# Patient Record
Sex: Female | Born: 1965 | Race: White | Hispanic: No | Marital: Married | Smoking: Never smoker
Health system: Southern US, Community
[De-identification: ages and names within clinical notes are randomized; demographics above are authoritative.]

## PROBLEM LIST (undated history)

## (undated) DIAGNOSIS — I341 Nonrheumatic mitral (valve) prolapse: Secondary | ICD-10-CM

## (undated) HISTORY — DX: Nonrheumatic mitral (valve) prolapse: I34.1

---

## 2005-04-09 ENCOUNTER — Other Ambulatory Visit: Admission: RE | Admit: 2005-04-09 | Discharge: 2005-04-09 | Payer: Self-pay | Admitting: Obstetrics and Gynecology

## 2005-07-06 ENCOUNTER — Encounter: Admission: RE | Admit: 2005-07-06 | Discharge: 2005-07-06 | Payer: Self-pay | Admitting: Obstetrics and Gynecology

## 2006-07-19 HISTORY — PX: DILATION AND CURETTAGE OF UTERUS: SHX78

## 2006-08-02 ENCOUNTER — Encounter (INDEPENDENT_AMBULATORY_CARE_PROVIDER_SITE_OTHER): Payer: Self-pay | Admitting: *Deleted

## 2006-08-02 ENCOUNTER — Ambulatory Visit (HOSPITAL_COMMUNITY): Admission: RE | Admit: 2006-08-02 | Discharge: 2006-08-02 | Payer: Self-pay | Admitting: Obstetrics and Gynecology

## 2006-08-02 ENCOUNTER — Ambulatory Visit: Admission: RE | Admit: 2006-08-02 | Discharge: 2006-08-02 | Payer: Self-pay | Admitting: Obstetrics and Gynecology

## 2006-08-09 ENCOUNTER — Encounter (INDEPENDENT_AMBULATORY_CARE_PROVIDER_SITE_OTHER): Payer: Self-pay | Admitting: Specialist

## 2006-08-09 ENCOUNTER — Ambulatory Visit (HOSPITAL_COMMUNITY): Admission: RE | Admit: 2006-08-09 | Discharge: 2006-08-09 | Payer: Self-pay | Admitting: Obstetrics and Gynecology

## 2007-08-18 ENCOUNTER — Inpatient Hospital Stay (HOSPITAL_COMMUNITY): Admission: RE | Admit: 2007-08-18 | Discharge: 2007-08-20 | Payer: Self-pay | Admitting: Obstetrics and Gynecology

## 2008-01-11 ENCOUNTER — Encounter: Admission: RE | Admit: 2008-01-11 | Discharge: 2008-01-11 | Payer: Self-pay | Admitting: Obstetrics and Gynecology

## 2008-04-26 ENCOUNTER — Ambulatory Visit (HOSPITAL_COMMUNITY): Admission: RE | Admit: 2008-04-26 | Discharge: 2008-04-26 | Payer: Self-pay | Admitting: Ophthalmology

## 2008-04-27 ENCOUNTER — Ambulatory Visit (HOSPITAL_COMMUNITY): Admission: RE | Admit: 2008-04-27 | Discharge: 2008-04-27 | Payer: Self-pay | Admitting: Ophthalmology

## 2008-07-19 HISTORY — PX: RETINAL DETACHMENT SURGERY: SHX105

## 2008-10-07 ENCOUNTER — Encounter: Admission: RE | Admit: 2008-10-07 | Discharge: 2008-10-07 | Payer: Self-pay | Admitting: Obstetrics and Gynecology

## 2010-08-09 ENCOUNTER — Encounter (HOSPITAL_COMMUNITY): Payer: Self-pay | Admitting: Obstetrics and Gynecology

## 2010-12-01 NOTE — Op Note (Signed)
Mallory Daniels, Mallory Daniels               ACCOUNT NO.:  [id number]   MEDICAL RECORD NO.:  [id number]          PATIENT TYPE:  AMB   LOCATION:  SDS                          FACILITY:  [facility]   PHYSICIAN:  Donnel Saxon, MD    DATE OF BIRTH:  09/08/1965   DATE OF PROCEDURE:  04/27/2008  DATE OF DISCHARGE:                               OPERATIVE REPORT   SURGEON:  Donnel Saxon, MD   ANESTHESIA:  General with endotracheal intubation.   PREOPERATIVE DIAGNOSES:  1. Retinal detachment, right eye.  2. 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   OPERATION PROCEDURE:  1. Scleral buckle procedure, right eye.  2. Laser demarcation and prophylaxis of the left eye.   COMPLICATIONS:  None.   FINDINGS:  There is a retinal detachment right eye and lattice  degeneration at 6 o'clock in the left eye   DESCRIPTION OF PROCEDURE:  The patient was identified in the preop  holding area.  She was then taken to the operating room where she was  sedated and placed under general anesthesia without difficulty.  At that  point in time, the right eye was prepped and draped in the usual sterile  fashion for ocular surgery including trimming of lashes.  A Lieberman  speculum was placed between the right upper and lower eyelid for  exposure.  A 360-degree conjunctival peritomy was performed with 0.3  forceps and Westcott scissors to prepare the eye for standard scleral  buckle.  Tenon capsule was dissected off the scleral all 4 quadrants  using Stevens tenotomy scissors.  Four recti muscles were isolated and  looped using 2-0 silk suture.  The sclera was inspected using a Watzke  retractor and found to be capable of supporting the scleral buckle.  The  eye was then inspected with indirect ophthalmoscopy.  The location of  the retinal break in the inferior nasal quadrant was identified and  marked externally with a surgical pen using an O'Connor scleral  indenter.  Next, confluent cryotherapy was  placed around the retinal  break.  Next, 4 horizontal mattress sutures were placed in the oblique  quadrants using a 5-0 nylon suture.  The eye was then encircled with a  #220 buckle.  At the end of the buckle brought in the inferior temporal  quadrant and joined with a #270 sleeve.  A #220 tier was then used to  support the retinal break in the inferior temporal quadrant and tucked  under the #220 buckle.  The 5-0 nylon sutures that had been previously  placed were then tied and the knots were rotated posteriorly and  trimmed.  Prior to tightening up the buckle, a radial cutdown was  performed in the bed of the inferior temporal horizontal mattress  suture.  This was brought down to bare choroid which was then cauterized  using disposable cautery.  A 30 gauge needle was then used to perforate  the choroid in this location.  Spontaneous egress of subretinal fluid  was then observed.  This stops spontaneously after a copious amount of  subretinal fluid is drained.  The eye was inspected with indirect  ophthalmoscopy and found to have a near total drain of the subretinal  fluid.  The buckle was secured in place by tying the 5-0 nylon sutures.  Next, the an injection of intraocular gas using C3F8 100% was performed.  A 0.2 mL volume was injected through the pars plana using a 30 gauge  needle.  Intraocular pressure was assessed and found to be physiologic.  The anterior chamber tap was not necessary due to the significant amount  of subretinal fluid, which was drained through the external drain site.  The buckle was repositioned and tied into place using a 5-0 nylon  sutures.  The 2-0 silk sutures were then removed and the eye was  inspected with indirect ophthalmoscopy.  There was excellent support of  the retinal break.  The conjunctiva and Tenon capsule were  reapproximated to the limbus using 7-0 Vicryl suture.  A eye was then  treated with subconjunctival injections of 25 mg of Ancef and  4 mg of  dexamethasone.  The eye was then treated topically with 1 drop of 1%  atropine and TobraDex ointment.  A Lieberman speculum was removed.  Eyelids were cleaned and closed.  They were then fashioned and shielded.  Attention was then directed towards the left eye.  A speculum was placed  between the left upper and lower eyelids for exposure.  Indirect  ophthalmoscopy was used to identify lattice degeneration in the inferior  peripheral retina.  This was surrounded by confluent laser without  difficulty.  The speculum was removed.  The patient then awoke from the  procedure having tolerated the procedure well and was taken to recovery  in excellent condition.      Donnel Saxon, MD  Electronically Signed     Donnel Saxon, MD  Electronically Signed    JBS/MEDQ  D:  04/27/2008  T:  04/27/2008  Job:  [id number]

## 2010-12-04 NOTE — Op Note (Signed)
Mallory Daniels, Mallory Daniels               ACCOUNT NO.:  [id number]   MEDICAL RECORD NO.:  [id number]          PATIENT TYPE:  AMB   LOCATION:  SDC                           FACILITY:  [facility]   PHYSICIAN:  Zelphia Cairo, MD    DATE OF BIRTH:  06/22/1966   DATE OF PROCEDURE:  08/09/2006  DATE OF DISCHARGE:                               OPERATIVE REPORT   PREOPERATIVE DIAGNOSIS:  Missed abortion.   POSTOPERATIVE DIAGNOSIS:  Missed abortion.   PROCEDURE:  Suction dilatation and evacuation.   SURGEON:  Zelphia Cairo, M.D.   ASSISTANT:  None.   ANESTHESIA:  MAC   SPECIMEN:  Products of conception.   ESTIMATED BLOOD LOSS:  Minimal.   COMPLICATIONS:  None.   CONDITION:  Stable to recovery room.   DESCRIPTION OF PROCEDURE:  The patient was taken to the operating room  where she was adequately placed under MAC anesthesia.  She was placed in  the dorsal lithotomy position using Allen stirrups.  She was prepped and  draped in a sterile fashion.  A Foley catheter was used to drain her  bladder for approximately 200 mL of clear urine. A bivalve speculum was  then placed in the vagina and a single tooth tenaculum was placed on the  anterior lip of the cervix.  A syringe with a long spinal needle was  then used to provide local cervical anesthesia using 1% Nesacaine.  The  cervix was then serially dilated using Shawnie Pons and Hegar dilators.  An 8-  French suction catheter was then used to evacuate products of conception  from the uterus.  A curet was then used to gently curet to ensure that  all products of conception had been removed. The single tooth tenaculum  and speculum were then removed.  The patient tolerated the procedure  well.  Sponge, lap, needle, and instrument counts were correct x2.  She  was discharged home in stable condition.      Zelphia Cairo, MD  Electronically Signed     GA/MEDQ  D:  08/09/2006  T:  08/09/2006  Job:  [id number]

## 2011-04-08 LAB — CCBB MATERNAL DONOR DRAW

## 2011-04-08 LAB — RPR: RPR Ser Ql: NONREACTIVE

## 2011-04-08 LAB — CBC
Hemoglobin: 9.7 — ABNORMAL LOW
MCHC: 34.8
Platelets: 192
Platelets: 193
RBC: 3.91
RDW: 13.4
WBC: 6.8

## 2011-04-20 LAB — CBC
Hemoglobin: 12.2
MCHC: 33.5
RBC: 4.01
WBC: 6.6

## 2011-09-09 ENCOUNTER — Other Ambulatory Visit: Payer: Self-pay

## 2011-09-17 ENCOUNTER — Other Ambulatory Visit: Payer: Self-pay | Admitting: Internal Medicine

## 2011-09-17 DIAGNOSIS — H9319 Tinnitus, unspecified ear: Secondary | ICD-10-CM

## 2011-09-24 ENCOUNTER — Ambulatory Visit
Admission: RE | Admit: 2011-09-24 | Discharge: 2011-09-24 | Disposition: A | Discharge status: Home / Self Care | Payer: BC Managed Care – PPO | Source: Ambulatory Visit | Attending: Internal Medicine | Admitting: Internal Medicine

## 2011-09-24 DIAGNOSIS — H9319 Tinnitus, unspecified ear: Secondary | ICD-10-CM

## 2012-02-14 ENCOUNTER — Other Ambulatory Visit: Payer: Self-pay | Admitting: Obstetrics and Gynecology

## 2012-06-05 ENCOUNTER — Other Ambulatory Visit: Payer: Self-pay | Admitting: Orthopedic Surgery

## 2012-06-05 DIAGNOSIS — M25531 Pain in right wrist: Secondary | ICD-10-CM

## 2012-06-06 ENCOUNTER — Ambulatory Visit
Admission: RE | Admit: 2012-06-06 | Discharge: 2012-06-06 | Disposition: A | Discharge status: Home / Self Care | Payer: Managed Care, Other (non HMO) | Source: Ambulatory Visit | Attending: Orthopedic Surgery | Admitting: Orthopedic Surgery

## 2012-06-06 DIAGNOSIS — M25531 Pain in right wrist: Secondary | ICD-10-CM

## 2013-03-28 ENCOUNTER — Other Ambulatory Visit: Payer: Self-pay | Admitting: Obstetrics and Gynecology

## 2013-07-19 HISTORY — PX: BREAST BIOPSY: SHX20

## 2013-10-03 ENCOUNTER — Other Ambulatory Visit: Payer: Self-pay | Admitting: Obstetrics and Gynecology

## 2013-10-03 DIAGNOSIS — N6320 Unspecified lump in the left breast, unspecified quadrant: Secondary | ICD-10-CM

## 2013-10-11 ENCOUNTER — Other Ambulatory Visit: Payer: Self-pay | Admitting: Obstetrics and Gynecology

## 2013-10-11 ENCOUNTER — Ambulatory Visit
Admission: RE | Admit: 2013-10-11 | Discharge: 2013-10-11 | Disposition: A | Discharge status: Home / Self Care | Payer: Private Health Insurance - Indemnity | Source: Ambulatory Visit | Attending: Obstetrics and Gynecology | Admitting: Obstetrics and Gynecology

## 2013-10-11 ENCOUNTER — Other Ambulatory Visit: Payer: Managed Care, Other (non HMO)

## 2013-10-11 ENCOUNTER — Ambulatory Visit
Admission: RE | Admit: 2013-10-11 | Discharge: 2013-10-11 | Disposition: A | Discharge status: Home / Self Care | Payer: Managed Care, Other (non HMO) | Source: Ambulatory Visit | Attending: Obstetrics and Gynecology | Admitting: Obstetrics and Gynecology

## 2013-10-11 DIAGNOSIS — N6320 Unspecified lump in the left breast, unspecified quadrant: Secondary | ICD-10-CM

## 2013-10-25 ENCOUNTER — Other Ambulatory Visit: Payer: Self-pay | Admitting: Obstetrics and Gynecology

## 2013-10-25 ENCOUNTER — Ambulatory Visit
Admission: RE | Admit: 2013-10-25 | Discharge: 2013-10-25 | Disposition: A | Discharge status: Home / Self Care | Payer: Managed Care, Other (non HMO) | Source: Ambulatory Visit | Attending: Obstetrics and Gynecology | Admitting: Obstetrics and Gynecology

## 2013-10-25 DIAGNOSIS — N6320 Unspecified lump in the left breast, unspecified quadrant: Secondary | ICD-10-CM

## 2014-03-14 ENCOUNTER — Other Ambulatory Visit: Payer: Self-pay | Admitting: Obstetrics and Gynecology

## 2014-03-15 LAB — CYTOLOGY - PAP

## 2014-03-18 ENCOUNTER — Other Ambulatory Visit: Payer: Self-pay | Admitting: Obstetrics and Gynecology

## 2014-03-18 DIAGNOSIS — N6320 Unspecified lump in the left breast, unspecified quadrant: Secondary | ICD-10-CM

## 2014-04-16 ENCOUNTER — Other Ambulatory Visit (HOSPITAL_COMMUNITY): Payer: Self-pay | Admitting: Obstetrics and Gynecology

## 2014-04-16 DIAGNOSIS — R1031 Right lower quadrant pain: Secondary | ICD-10-CM

## 2014-04-22 ENCOUNTER — Ambulatory Visit (HOSPITAL_COMMUNITY): Payer: Private Health Insurance - Indemnity

## 2014-04-23 ENCOUNTER — Ambulatory Visit (HOSPITAL_COMMUNITY)
Admission: RE | Admit: 2014-04-23 | Discharge: 2014-04-23 | Disposition: A | Discharge status: Home / Self Care | Payer: Managed Care, Other (non HMO) | Source: Ambulatory Visit | Attending: Obstetrics and Gynecology | Admitting: Obstetrics and Gynecology

## 2014-04-23 ENCOUNTER — Encounter (HOSPITAL_COMMUNITY): Payer: Self-pay

## 2014-04-23 DIAGNOSIS — R1031 Right lower quadrant pain: Secondary | ICD-10-CM | POA: Diagnosis not present

## 2014-04-23 MED ORDER — IOHEXOL 300 MG/ML  SOLN
100.0000 mL | Freq: Once | INTRAMUSCULAR | Status: AC | PRN
  Administered 2014-04-23: 100 mL via INTRAVENOUS

## 2014-04-25 ENCOUNTER — Ambulatory Visit
Admission: RE | Admit: 2014-04-25 | Discharge: 2014-04-25 | Disposition: A | Discharge status: Home / Self Care | Payer: Managed Care, Other (non HMO) | Source: Ambulatory Visit | Attending: Obstetrics and Gynecology | Admitting: Obstetrics and Gynecology

## 2014-04-25 DIAGNOSIS — N6320 Unspecified lump in the left breast, unspecified quadrant: Secondary | ICD-10-CM

## 2014-06-04 ENCOUNTER — Other Ambulatory Visit: Payer: Self-pay

## 2015-01-23 ENCOUNTER — Other Ambulatory Visit: Payer: Self-pay | Admitting: Obstetrics and Gynecology

## 2015-01-23 DIAGNOSIS — N6311 Unspecified lump in the right breast, upper outer quadrant: Secondary | ICD-10-CM

## 2015-01-30 ENCOUNTER — Ambulatory Visit
Admission: RE | Admit: 2015-01-30 | Discharge: 2015-01-30 | Disposition: A | Discharge status: Home / Self Care | Payer: Managed Care, Other (non HMO) | Source: Ambulatory Visit | Attending: Obstetrics and Gynecology | Admitting: Obstetrics and Gynecology

## 2015-01-30 DIAGNOSIS — N6311 Unspecified lump in the right breast, upper outer quadrant: Secondary | ICD-10-CM

## 2015-03-31 ENCOUNTER — Other Ambulatory Visit: Payer: Self-pay | Admitting: Obstetrics and Gynecology

## 2015-04-01 LAB — CYTOLOGY - PAP

## 2015-04-22 ENCOUNTER — Other Ambulatory Visit: Payer: Self-pay

## 2015-04-22 DIAGNOSIS — Z1231 Encounter for screening mammogram for malignant neoplasm of breast: Secondary | ICD-10-CM

## 2015-05-07 ENCOUNTER — Ambulatory Visit
Admission: RE | Admit: 2015-05-07 | Discharge: 2015-05-07 | Disposition: A | Discharge status: Home / Self Care | Payer: Managed Care, Other (non HMO) | Source: Ambulatory Visit

## 2015-05-07 DIAGNOSIS — Z1231 Encounter for screening mammogram for malignant neoplasm of breast: Secondary | ICD-10-CM

## 2015-05-09 ENCOUNTER — Other Ambulatory Visit: Payer: Self-pay | Admitting: Obstetrics and Gynecology

## 2015-05-09 DIAGNOSIS — R928 Other abnormal and inconclusive findings on diagnostic imaging of breast: Secondary | ICD-10-CM

## 2015-05-15 ENCOUNTER — Ambulatory Visit
Admission: RE | Admit: 2015-05-15 | Discharge: 2015-05-15 | Disposition: A | Discharge status: Home / Self Care | Payer: Managed Care, Other (non HMO) | Source: Ambulatory Visit | Attending: Obstetrics and Gynecology | Admitting: Obstetrics and Gynecology

## 2015-05-15 DIAGNOSIS — R928 Other abnormal and inconclusive findings on diagnostic imaging of breast: Secondary | ICD-10-CM

## 2015-11-24 DIAGNOSIS — H7292 Unspecified perforation of tympanic membrane, left ear: Secondary | ICD-10-CM | POA: Diagnosis not present

## 2016-02-18 DIAGNOSIS — H93293 Other abnormal auditory perceptions, bilateral: Secondary | ICD-10-CM | POA: Diagnosis not present

## 2016-02-18 DIAGNOSIS — H903 Sensorineural hearing loss, bilateral: Secondary | ICD-10-CM | POA: Diagnosis not present

## 2016-02-23 DIAGNOSIS — E559 Vitamin D deficiency, unspecified: Secondary | ICD-10-CM | POA: Diagnosis not present

## 2016-02-23 DIAGNOSIS — Z0000 Encounter for general adult medical examination without abnormal findings: Secondary | ICD-10-CM | POA: Diagnosis not present

## 2016-03-01 DIAGNOSIS — Z0000 Encounter for general adult medical examination without abnormal findings: Secondary | ICD-10-CM | POA: Diagnosis not present

## 2016-03-19 HISTORY — PX: BREAST CYST ASPIRATION: SHX578

## 2016-04-07 ENCOUNTER — Other Ambulatory Visit: Payer: Self-pay | Admitting: Obstetrics and Gynecology

## 2016-04-07 DIAGNOSIS — Z6820 Body mass index (BMI) 20.0-20.9, adult: Secondary | ICD-10-CM | POA: Diagnosis not present

## 2016-04-07 DIAGNOSIS — N6320 Unspecified lump in the left breast, unspecified quadrant: Secondary | ICD-10-CM

## 2016-04-07 DIAGNOSIS — Z01419 Encounter for gynecological examination (general) (routine) without abnormal findings: Secondary | ICD-10-CM | POA: Diagnosis not present

## 2016-04-09 ENCOUNTER — Ambulatory Visit
Admission: RE | Admit: 2016-04-09 | Discharge: 2016-04-09 | Disposition: A | Discharge status: Home / Self Care | Payer: BLUE CROSS/BLUE SHIELD | Source: Ambulatory Visit | Attending: Obstetrics and Gynecology | Admitting: Obstetrics and Gynecology

## 2016-04-09 ENCOUNTER — Other Ambulatory Visit: Payer: Self-pay | Admitting: Obstetrics and Gynecology

## 2016-04-09 DIAGNOSIS — N63 Unspecified lump in breast: Secondary | ICD-10-CM | POA: Diagnosis not present

## 2016-04-09 DIAGNOSIS — N6002 Solitary cyst of left breast: Secondary | ICD-10-CM | POA: Diagnosis not present

## 2016-04-09 DIAGNOSIS — N6320 Unspecified lump in the left breast, unspecified quadrant: Secondary | ICD-10-CM

## 2016-04-27 DIAGNOSIS — N6002 Solitary cyst of left breast: Secondary | ICD-10-CM | POA: Diagnosis not present

## 2016-05-03 ENCOUNTER — Other Ambulatory Visit: Payer: Self-pay | Admitting: Obstetrics and Gynecology

## 2016-05-03 DIAGNOSIS — Z1231 Encounter for screening mammogram for malignant neoplasm of breast: Secondary | ICD-10-CM

## 2016-05-11 ENCOUNTER — Ambulatory Visit
Admission: RE | Admit: 2016-05-11 | Discharge: 2016-05-11 | Disposition: A | Discharge status: Home / Self Care | Payer: BLUE CROSS/BLUE SHIELD | Source: Ambulatory Visit | Attending: Obstetrics and Gynecology | Admitting: Obstetrics and Gynecology

## 2016-05-11 DIAGNOSIS — Z1231 Encounter for screening mammogram for malignant neoplasm of breast: Secondary | ICD-10-CM | POA: Diagnosis not present

## 2016-06-22 DIAGNOSIS — D2262 Melanocytic nevi of left upper limb, including shoulder: Secondary | ICD-10-CM | POA: Diagnosis not present

## 2016-06-22 DIAGNOSIS — D225 Melanocytic nevi of trunk: Secondary | ICD-10-CM | POA: Diagnosis not present

## 2016-06-22 DIAGNOSIS — D2371 Other benign neoplasm of skin of right lower limb, including hip: Secondary | ICD-10-CM | POA: Diagnosis not present

## 2016-06-22 DIAGNOSIS — Z85828 Personal history of other malignant neoplasm of skin: Secondary | ICD-10-CM | POA: Diagnosis not present

## 2017-03-22 DIAGNOSIS — Z0000 Encounter for general adult medical examination without abnormal findings: Secondary | ICD-10-CM | POA: Diagnosis not present

## 2017-03-22 DIAGNOSIS — E559 Vitamin D deficiency, unspecified: Secondary | ICD-10-CM | POA: Diagnosis not present

## 2017-03-28 DIAGNOSIS — Z0000 Encounter for general adult medical examination without abnormal findings: Secondary | ICD-10-CM | POA: Diagnosis not present

## 2017-04-01 ENCOUNTER — Encounter: Payer: Self-pay | Admitting: Gastroenterology

## 2017-04-01 ENCOUNTER — Other Ambulatory Visit: Payer: Self-pay | Admitting: Obstetrics and Gynecology

## 2017-04-01 DIAGNOSIS — Z1231 Encounter for screening mammogram for malignant neoplasm of breast: Secondary | ICD-10-CM

## 2017-04-28 DIAGNOSIS — Z01419 Encounter for gynecological examination (general) (routine) without abnormal findings: Secondary | ICD-10-CM | POA: Diagnosis not present

## 2017-04-28 DIAGNOSIS — Z6820 Body mass index (BMI) 20.0-20.9, adult: Secondary | ICD-10-CM | POA: Diagnosis not present

## 2017-05-01 DIAGNOSIS — Z23 Encounter for immunization: Secondary | ICD-10-CM | POA: Diagnosis not present

## 2017-05-16 ENCOUNTER — Ambulatory Visit
Admission: RE | Admit: 2017-05-16 | Discharge: 2017-05-16 | Disposition: A | Discharge status: Home / Self Care | Payer: BLUE CROSS/BLUE SHIELD | Source: Ambulatory Visit | Attending: Obstetrics and Gynecology | Admitting: Obstetrics and Gynecology

## 2017-05-16 DIAGNOSIS — Z1231 Encounter for screening mammogram for malignant neoplasm of breast: Secondary | ICD-10-CM

## 2017-05-17 ENCOUNTER — Ambulatory Visit (AMBULATORY_SURGERY_CENTER): Payer: Self-pay | Admitting: *Deleted

## 2017-05-17 ENCOUNTER — Other Ambulatory Visit: Payer: Self-pay | Admitting: Obstetrics and Gynecology

## 2017-05-17 VITALS — Ht 72.0 in | Wt 145.2 lb

## 2017-05-17 DIAGNOSIS — N6320 Unspecified lump in the left breast, unspecified quadrant: Secondary | ICD-10-CM

## 2017-05-17 DIAGNOSIS — Z1211 Encounter for screening for malignant neoplasm of colon: Secondary | ICD-10-CM

## 2017-05-17 DIAGNOSIS — N6310 Unspecified lump in the right breast, unspecified quadrant: Secondary | ICD-10-CM

## 2017-05-17 MED ORDER — NA SULFATE-K SULFATE-MG SULF 17.5-3.13-1.6 GM/177ML PO SOLN: 1.0000 [IU] | Freq: Once | ORAL | 0 refills | Status: AC

## 2017-05-17 NOTE — Progress Notes (Signed)
No egg or soy allergy known to patient  No issues with past sedation with any surgeries  or procedures, no intubation problems  No diet pills per patient No home 02 use per patient  No blood thinners per patient  Pt denies issues with constipation  No A fib or A flutter Arrhythmia EMMI video sent to pt's e mail

## 2017-05-23 ENCOUNTER — Ambulatory Visit
Admission: RE | Admit: 2017-05-23 | Discharge: 2017-05-23 | Disposition: A | Discharge status: Home / Self Care | Payer: BLUE CROSS/BLUE SHIELD | Source: Ambulatory Visit | Attending: Obstetrics and Gynecology | Admitting: Obstetrics and Gynecology

## 2017-05-23 DIAGNOSIS — N6310 Unspecified lump in the right breast, unspecified quadrant: Secondary | ICD-10-CM

## 2017-05-23 DIAGNOSIS — R922 Inconclusive mammogram: Secondary | ICD-10-CM | POA: Diagnosis not present

## 2017-05-23 DIAGNOSIS — N6320 Unspecified lump in the left breast, unspecified quadrant: Secondary | ICD-10-CM

## 2017-05-23 DIAGNOSIS — N6489 Other specified disorders of breast: Secondary | ICD-10-CM | POA: Diagnosis not present

## 2017-05-31 ENCOUNTER — Encounter: Payer: Self-pay | Admitting: Gastroenterology

## 2017-06-03 ENCOUNTER — Other Ambulatory Visit: Payer: Self-pay

## 2017-06-03 ENCOUNTER — Encounter: Payer: Self-pay | Admitting: Gastroenterology

## 2017-06-03 ENCOUNTER — Ambulatory Visit (AMBULATORY_SURGERY_CENTER): Payer: BLUE CROSS/BLUE SHIELD | Admitting: Gastroenterology

## 2017-06-03 VITALS — BP 96/57 | HR 54 | Temp 96.8°F | Resp 20 | Ht 72.0 in | Wt 145.0 lb

## 2017-06-03 DIAGNOSIS — Z1212 Encounter for screening for malignant neoplasm of rectum: Secondary | ICD-10-CM | POA: Diagnosis not present

## 2017-06-03 DIAGNOSIS — Z1211 Encounter for screening for malignant neoplasm of colon: Secondary | ICD-10-CM

## 2017-06-03 MED ORDER — SODIUM CHLORIDE 0.9 % IV SOLN: 500.0000 mL | INTRAVENOUS | Status: DC

## 2017-06-03 NOTE — Progress Notes (Signed)
Pt's states no medical or surgical changes since previsit or office visit. 

## 2017-06-03 NOTE — Op Note (Signed)
[facility] Patient Name: Mallory GrumblingCheri Daniels Procedure Date: 06/03/2017 9:51 AM MRN: [id number] Endoscopist: Viviann SpareSteven P. Muneer Leider MD, MD Age: 51 Referring MD:  Date of Birth: 1965-12-17 Gender: Female Account #: [id number] Procedure:                Colonoscopy Indications:              Screening for colorectal malignant neoplasm, This                            is the patient's first colonoscopy Medicines:                Monitored Anesthesia Care Procedure:                Pre-Anesthesia Assessment:                           - Prior to the procedure, a History and Physical                            was performed, and patient medications and                            allergies were reviewed. The patient's tolerance of                            previous anesthesia was also reviewed. The risks                            and benefits of the procedure and the sedation                            options and risks were discussed with the patient.                            All questions were answered, and informed consent                            was obtained. Prior Anticoagulants: The patient has                            taken no previous anticoagulant or antiplatelet                            agents. ASA Grade Assessment: I - A normal, healthy                            patient. After reviewing the risks and benefits,                            the patient was deemed in satisfactory condition to                            undergo the procedure.  After obtaining informed consent, the colonoscope                            was passed under direct vision. Throughout the                            procedure, the patient's blood pressure, pulse, and                            oxygen saturations were monitored continuously. The                            Colonoscope was introduced through the anus and                            advanced to the the cecum,  identified by                            appendiceal orifice and ileocecal valve. The                            colonoscopy was performed without difficulty. The                            patient tolerated the procedure well. The quality                            of the bowel preparation was good. The ileocecal                            valve, appendiceal orifice, and rectum were                            photographed. Scope In: 9:56:30 AM Scope Out: 10:11:39 AM Scope Withdrawal Time: 0 hours 12 minutes 10 seconds  Total Procedure Duration: 0 hours 15 minutes 9 seconds  Findings:                 The perianal and digital rectal examinations were                            normal.                           Anal papilla(e) were hypertrophied.                           The exam was otherwise without abnormality. No                            polyps. Complications:            No immediate complications. Estimated blood loss:                            None. Estimated Blood Loss:     Estimated blood loss: none.  Impression:               - Anal papilla(e) were hypertrophied.                           - The examination was otherwise normal.                           - No polyps. Recommendation:           - Patient has a contact number available for                            emergencies. The signs and symptoms of potential                            delayed complications were discussed with the                            patient. Return to normal activities tomorrow.                            Written discharge instructions were provided to the                            patient.                           - Resume previous diet.                           - Continue present medications.                           - Repeat colonoscopy in 10 years for screening                            purposes. Viviann Spare P. Christipher Rieger MD, MD 06/03/2017 10:15:20 AM This report has been signed  electronically.

## 2017-06-03 NOTE — Patient Instructions (Signed)
YOU HAD AN ENDOSCOPIC PROCEDURE TODAY AT THE [facility]:   Refer to the procedure report that was given to you for any specific questions about what was found during the examination.  If the procedure report does not answer your questions, please call your gastroenterologist to clarify.  If you requested that your care partner not be given the details of your procedure findings, then the procedure report has been included in a sealed envelope for you to review at your convenience later.  YOU SHOULD EXPECT: Some feelings of bloating in the abdomen. Passage of more gas than usual.  Walking can help get rid of the air that was put into your GI tract during the procedure and reduce the bloating. If you had a lower endoscopy (such as a colonoscopy or flexible sigmoidoscopy) you may notice spotting of blood in your stool or on the toilet paper. If you underwent a bowel prep for your procedure, you may not have a normal bowel movement for a few days.  Please Note:  You might notice some irritation and congestion in your nose or some drainage.  This is from the oxygen used during your procedure.  There is no need for concern and it should clear up in a day or so.  SYMPTOMS TO REPORT IMMEDIATELY:   Following lower endoscopy (colonoscopy or flexible sigmoidoscopy):  Excessive amounts of blood in the stool  Significant tenderness or worsening of abdominal pains  Swelling of the abdomen that is new, acute  Fever of 100F or higher  For urgent or emergent issues, a gastroenterologist can be reached at any hour by calling [phone].   DIET:  We do recommend a small meal at first, but then you may proceed to your regular diet.  Drink plenty of fluids but you should avoid alcoholic beverages for 24 hours.  ACTIVITY:  You should plan to take it easy for the rest of today and you should NOT DRIVE or use heavy machinery until tomorrow (because of the sedation medicines used during the test).     FOLLOW UP: Our staff will call the number listed on your records the next business day following your procedure to check on you and address any questions or concerns that you may have regarding the information given to you following your procedure. If we do not reach you, we will leave a message.  However, if you are feeling well and you are not experiencing any problems, there is no need to return our call.  We will assume that you have returned to your regular daily activities without incident.  If any biopsies were taken you will be contacted by phone or by letter within the next 1-3 weeks.  Please call us at [phone] if you have not heard about the biopsies in 3 weeks.    SIGNATURES/CONFIDENTIALITY: You and/or your care partner have signed paperwork which will be entered into your electronic medical record.  These signatures attest to the fact that that the information above on your After Visit Summary has been reviewed and is understood.  Full responsibility of the confidentiality of this discharge information lies with you and/or your care-partner. 

## 2017-06-03 NOTE — Progress Notes (Signed)
Report given to PACU, vss 

## 2017-06-06 ENCOUNTER — Telehealth: Payer: Self-pay

## 2017-06-06 NOTE — Telephone Encounter (Signed)
  Follow up Call-  Call Bianka Liberati number 06/03/2017  Post procedure Call Ovila Lepage phone  # [phone]  Permission to leave phone message Yes  Some recent data might be hidden     Patient questions:  Do you have a fever, pain , or abdominal swelling? No. Pain Score  0 *  Have you tolerated food without any problems? Yes.    Have you been able to return to your normal activities? Yes.    Do you have any questions about your discharge instructions: Diet   No. Medications  No. Follow up visit  No.  Do you have questions or concerns about your Care? No.  Actions: * If pain score is 4 or above: No action needed, pain <4.

## 2017-06-23 DIAGNOSIS — D225 Melanocytic nevi of trunk: Secondary | ICD-10-CM | POA: Diagnosis not present

## 2017-06-23 DIAGNOSIS — D2262 Melanocytic nevi of left upper limb, including shoulder: Secondary | ICD-10-CM | POA: Diagnosis not present

## 2017-06-23 DIAGNOSIS — L720 Epidermal cyst: Secondary | ICD-10-CM | POA: Diagnosis not present

## 2017-06-23 DIAGNOSIS — L821 Other seborrheic keratosis: Secondary | ICD-10-CM | POA: Diagnosis not present

## 2017-08-22 DIAGNOSIS — R079 Chest pain, unspecified: Secondary | ICD-10-CM | POA: Diagnosis not present

## 2017-08-22 DIAGNOSIS — I341 Nonrheumatic mitral (valve) prolapse: Secondary | ICD-10-CM | POA: Diagnosis not present

## 2017-09-02 DIAGNOSIS — I499 Cardiac arrhythmia, unspecified: Secondary | ICD-10-CM | POA: Diagnosis not present

## 2017-09-02 DIAGNOSIS — R0789 Other chest pain: Secondary | ICD-10-CM | POA: Diagnosis not present

## 2017-09-14 DIAGNOSIS — R0789 Other chest pain: Secondary | ICD-10-CM | POA: Diagnosis not present

## 2017-09-14 DIAGNOSIS — R002 Palpitations: Secondary | ICD-10-CM | POA: Diagnosis not present

## 2017-09-19 DIAGNOSIS — R0789 Other chest pain: Secondary | ICD-10-CM | POA: Diagnosis not present

## 2017-09-19 DIAGNOSIS — R002 Palpitations: Secondary | ICD-10-CM | POA: Diagnosis not present

## 2018-01-24 DIAGNOSIS — N3946 Mixed incontinence: Secondary | ICD-10-CM | POA: Diagnosis not present

## 2018-01-24 DIAGNOSIS — R102 Pelvic and perineal pain: Secondary | ICD-10-CM | POA: Diagnosis not present

## 2018-01-24 DIAGNOSIS — M62838 Other muscle spasm: Secondary | ICD-10-CM | POA: Diagnosis not present

## 2018-01-24 DIAGNOSIS — M6281 Muscle weakness (generalized): Secondary | ICD-10-CM | POA: Diagnosis not present

## 2018-02-03 DIAGNOSIS — R102 Pelvic and perineal pain: Secondary | ICD-10-CM | POA: Diagnosis not present

## 2018-02-03 DIAGNOSIS — M6281 Muscle weakness (generalized): Secondary | ICD-10-CM | POA: Diagnosis not present

## 2018-02-03 DIAGNOSIS — N3946 Mixed incontinence: Secondary | ICD-10-CM | POA: Diagnosis not present

## 2018-02-03 DIAGNOSIS — M62838 Other muscle spasm: Secondary | ICD-10-CM | POA: Diagnosis not present

## 2018-02-14 DIAGNOSIS — R102 Pelvic and perineal pain: Secondary | ICD-10-CM | POA: Diagnosis not present

## 2018-02-14 DIAGNOSIS — M62838 Other muscle spasm: Secondary | ICD-10-CM | POA: Diagnosis not present

## 2018-02-14 DIAGNOSIS — N3946 Mixed incontinence: Secondary | ICD-10-CM | POA: Diagnosis not present

## 2018-02-14 DIAGNOSIS — M6281 Muscle weakness (generalized): Secondary | ICD-10-CM | POA: Diagnosis not present

## 2018-03-07 DIAGNOSIS — M6281 Muscle weakness (generalized): Secondary | ICD-10-CM | POA: Diagnosis not present

## 2018-03-07 DIAGNOSIS — N3946 Mixed incontinence: Secondary | ICD-10-CM | POA: Diagnosis not present

## 2018-03-07 DIAGNOSIS — M62838 Other muscle spasm: Secondary | ICD-10-CM | POA: Diagnosis not present

## 2018-03-07 DIAGNOSIS — R102 Pelvic and perineal pain: Secondary | ICD-10-CM | POA: Diagnosis not present

## 2018-03-15 DIAGNOSIS — M62838 Other muscle spasm: Secondary | ICD-10-CM | POA: Diagnosis not present

## 2018-03-15 DIAGNOSIS — M6281 Muscle weakness (generalized): Secondary | ICD-10-CM | POA: Diagnosis not present

## 2018-03-15 DIAGNOSIS — R102 Pelvic and perineal pain: Secondary | ICD-10-CM | POA: Diagnosis not present

## 2018-03-15 DIAGNOSIS — N8111 Cystocele, midline: Secondary | ICD-10-CM | POA: Diagnosis not present

## 2018-03-22 DIAGNOSIS — N3946 Mixed incontinence: Secondary | ICD-10-CM | POA: Diagnosis not present

## 2018-03-22 DIAGNOSIS — M6281 Muscle weakness (generalized): Secondary | ICD-10-CM | POA: Diagnosis not present

## 2018-03-22 DIAGNOSIS — M62838 Other muscle spasm: Secondary | ICD-10-CM | POA: Diagnosis not present

## 2018-03-22 DIAGNOSIS — N8111 Cystocele, midline: Secondary | ICD-10-CM | POA: Diagnosis not present

## 2018-03-29 DIAGNOSIS — E559 Vitamin D deficiency, unspecified: Secondary | ICD-10-CM | POA: Diagnosis not present

## 2018-03-29 DIAGNOSIS — Z0000 Encounter for general adult medical examination without abnormal findings: Secondary | ICD-10-CM | POA: Diagnosis not present

## 2018-04-05 DIAGNOSIS — N3946 Mixed incontinence: Secondary | ICD-10-CM | POA: Diagnosis not present

## 2018-04-05 DIAGNOSIS — M62838 Other muscle spasm: Secondary | ICD-10-CM | POA: Diagnosis not present

## 2018-04-05 DIAGNOSIS — R102 Pelvic and perineal pain: Secondary | ICD-10-CM | POA: Diagnosis not present

## 2018-04-05 DIAGNOSIS — M6281 Muscle weakness (generalized): Secondary | ICD-10-CM | POA: Diagnosis not present

## 2018-04-11 DIAGNOSIS — Z0000 Encounter for general adult medical examination without abnormal findings: Secondary | ICD-10-CM | POA: Diagnosis not present

## 2018-04-11 DIAGNOSIS — M8580 Other specified disorders of bone density and structure, unspecified site: Secondary | ICD-10-CM | POA: Diagnosis not present

## 2018-04-11 DIAGNOSIS — Z23 Encounter for immunization: Secondary | ICD-10-CM | POA: Diagnosis not present

## 2018-04-11 DIAGNOSIS — Z780 Asymptomatic menopausal state: Secondary | ICD-10-CM | POA: Diagnosis not present

## 2018-04-19 DIAGNOSIS — M62838 Other muscle spasm: Secondary | ICD-10-CM | POA: Diagnosis not present

## 2018-04-19 DIAGNOSIS — M6281 Muscle weakness (generalized): Secondary | ICD-10-CM | POA: Diagnosis not present

## 2018-04-19 DIAGNOSIS — N8111 Cystocele, midline: Secondary | ICD-10-CM | POA: Diagnosis not present

## 2018-04-19 DIAGNOSIS — N3946 Mixed incontinence: Secondary | ICD-10-CM | POA: Diagnosis not present

## 2018-04-20 ENCOUNTER — Other Ambulatory Visit: Payer: Self-pay | Admitting: Obstetrics and Gynecology

## 2018-04-20 DIAGNOSIS — Z1231 Encounter for screening mammogram for malignant neoplasm of breast: Secondary | ICD-10-CM

## 2018-04-25 DIAGNOSIS — M810 Age-related osteoporosis without current pathological fracture: Secondary | ICD-10-CM | POA: Diagnosis not present

## 2018-05-04 DIAGNOSIS — M62838 Other muscle spasm: Secondary | ICD-10-CM | POA: Diagnosis not present

## 2018-05-04 DIAGNOSIS — M6281 Muscle weakness (generalized): Secondary | ICD-10-CM | POA: Diagnosis not present

## 2018-05-04 DIAGNOSIS — N3946 Mixed incontinence: Secondary | ICD-10-CM | POA: Diagnosis not present

## 2018-05-04 DIAGNOSIS — N8111 Cystocele, midline: Secondary | ICD-10-CM | POA: Diagnosis not present

## 2018-05-11 DIAGNOSIS — Z6820 Body mass index (BMI) 20.0-20.9, adult: Secondary | ICD-10-CM | POA: Diagnosis not present

## 2018-05-11 DIAGNOSIS — Z01419 Encounter for gynecological examination (general) (routine) without abnormal findings: Secondary | ICD-10-CM | POA: Diagnosis not present

## 2018-05-11 DIAGNOSIS — R87810 Cervical high risk human papillomavirus (HPV) DNA test positive: Secondary | ICD-10-CM | POA: Diagnosis not present

## 2018-05-11 DIAGNOSIS — R87610 Atypical squamous cells of undetermined significance on cytologic smear of cervix (ASC-US): Secondary | ICD-10-CM | POA: Diagnosis not present

## 2018-05-17 DIAGNOSIS — N3946 Mixed incontinence: Secondary | ICD-10-CM | POA: Diagnosis not present

## 2018-05-17 DIAGNOSIS — M6281 Muscle weakness (generalized): Secondary | ICD-10-CM | POA: Diagnosis not present

## 2018-05-17 DIAGNOSIS — N8111 Cystocele, midline: Secondary | ICD-10-CM | POA: Diagnosis not present

## 2018-05-17 DIAGNOSIS — M62838 Other muscle spasm: Secondary | ICD-10-CM | POA: Diagnosis not present

## 2018-05-31 ENCOUNTER — Ambulatory Visit
Admission: RE | Admit: 2018-05-31 | Discharge: 2018-05-31 | Disposition: A | Discharge status: Home / Self Care | Payer: BLUE CROSS/BLUE SHIELD | Source: Ambulatory Visit | Attending: Obstetrics and Gynecology | Admitting: Obstetrics and Gynecology

## 2018-05-31 DIAGNOSIS — Z1231 Encounter for screening mammogram for malignant neoplasm of breast: Secondary | ICD-10-CM

## 2018-06-01 DIAGNOSIS — M62838 Other muscle spasm: Secondary | ICD-10-CM | POA: Diagnosis not present

## 2018-06-01 DIAGNOSIS — N8111 Cystocele, midline: Secondary | ICD-10-CM | POA: Diagnosis not present

## 2018-06-01 DIAGNOSIS — N3946 Mixed incontinence: Secondary | ICD-10-CM | POA: Diagnosis not present

## 2018-06-01 DIAGNOSIS — M6281 Muscle weakness (generalized): Secondary | ICD-10-CM | POA: Diagnosis not present

## 2018-06-08 DIAGNOSIS — R87612 Low grade squamous intraepithelial lesion on cytologic smear of cervix (LGSIL): Secondary | ICD-10-CM | POA: Diagnosis not present

## 2018-06-08 DIAGNOSIS — R87810 Cervical high risk human papillomavirus (HPV) DNA test positive: Secondary | ICD-10-CM | POA: Diagnosis not present

## 2018-06-08 DIAGNOSIS — N870 Mild cervical dysplasia: Secondary | ICD-10-CM | POA: Diagnosis not present

## 2018-06-23 DIAGNOSIS — M62838 Other muscle spasm: Secondary | ICD-10-CM | POA: Diagnosis not present

## 2018-06-23 DIAGNOSIS — D2371 Other benign neoplasm of skin of right lower limb, including hip: Secondary | ICD-10-CM | POA: Diagnosis not present

## 2018-06-23 DIAGNOSIS — L814 Other melanin hyperpigmentation: Secondary | ICD-10-CM | POA: Diagnosis not present

## 2018-06-23 DIAGNOSIS — D2262 Melanocytic nevi of left upper limb, including shoulder: Secondary | ICD-10-CM | POA: Diagnosis not present

## 2018-06-23 DIAGNOSIS — R102 Pelvic and perineal pain: Secondary | ICD-10-CM | POA: Diagnosis not present

## 2018-06-23 DIAGNOSIS — M25552 Pain in left hip: Secondary | ICD-10-CM | POA: Diagnosis not present

## 2018-06-23 DIAGNOSIS — D225 Melanocytic nevi of trunk: Secondary | ICD-10-CM | POA: Diagnosis not present

## 2018-06-23 DIAGNOSIS — M6281 Muscle weakness (generalized): Secondary | ICD-10-CM | POA: Diagnosis not present

## 2018-07-05 DIAGNOSIS — N8111 Cystocele, midline: Secondary | ICD-10-CM | POA: Diagnosis not present

## 2018-07-05 DIAGNOSIS — N3946 Mixed incontinence: Secondary | ICD-10-CM | POA: Diagnosis not present

## 2018-07-05 DIAGNOSIS — M62838 Other muscle spasm: Secondary | ICD-10-CM | POA: Diagnosis not present

## 2018-07-05 DIAGNOSIS — M6281 Muscle weakness (generalized): Secondary | ICD-10-CM | POA: Diagnosis not present

## 2018-08-16 DIAGNOSIS — M62838 Other muscle spasm: Secondary | ICD-10-CM | POA: Diagnosis not present

## 2018-08-16 DIAGNOSIS — M6281 Muscle weakness (generalized): Secondary | ICD-10-CM | POA: Diagnosis not present

## 2018-08-16 DIAGNOSIS — R102 Pelvic and perineal pain: Secondary | ICD-10-CM | POA: Diagnosis not present

## 2018-08-16 DIAGNOSIS — N8111 Cystocele, midline: Secondary | ICD-10-CM | POA: Diagnosis not present

## 2018-09-20 DIAGNOSIS — N8111 Cystocele, midline: Secondary | ICD-10-CM | POA: Diagnosis not present

## 2018-09-20 DIAGNOSIS — M62838 Other muscle spasm: Secondary | ICD-10-CM | POA: Diagnosis not present

## 2018-09-20 DIAGNOSIS — M6281 Muscle weakness (generalized): Secondary | ICD-10-CM | POA: Diagnosis not present

## 2018-09-20 DIAGNOSIS — N3946 Mixed incontinence: Secondary | ICD-10-CM | POA: Diagnosis not present

## 2018-09-21 DIAGNOSIS — L820 Inflamed seborrheic keratosis: Secondary | ICD-10-CM | POA: Diagnosis not present

## 2019-04-12 DIAGNOSIS — Z0000 Encounter for general adult medical examination without abnormal findings: Secondary | ICD-10-CM | POA: Diagnosis not present

## 2019-04-12 DIAGNOSIS — H0015 Chalazion left lower eyelid: Secondary | ICD-10-CM | POA: Diagnosis not present

## 2019-04-12 DIAGNOSIS — Z13220 Encounter for screening for lipoid disorders: Secondary | ICD-10-CM | POA: Diagnosis not present

## 2019-04-19 ENCOUNTER — Other Ambulatory Visit: Payer: Self-pay | Admitting: Obstetrics and Gynecology

## 2019-04-19 DIAGNOSIS — N6310 Unspecified lump in the right breast, unspecified quadrant: Secondary | ICD-10-CM | POA: Diagnosis not present

## 2019-04-19 DIAGNOSIS — N63 Unspecified lump in unspecified breast: Secondary | ICD-10-CM

## 2019-04-27 ENCOUNTER — Ambulatory Visit
Admission: RE | Admit: 2019-04-27 | Discharge: 2019-04-27 | Disposition: A | Discharge status: Home / Self Care | Payer: BLUE CROSS/BLUE SHIELD | Source: Ambulatory Visit | Attending: Obstetrics and Gynecology | Admitting: Obstetrics and Gynecology

## 2019-04-27 ENCOUNTER — Other Ambulatory Visit: Payer: Self-pay

## 2019-04-27 ENCOUNTER — Ambulatory Visit
Admission: RE | Admit: 2019-04-27 | Discharge: 2019-04-27 | Disposition: A | Discharge status: Home / Self Care | Payer: BC Managed Care – PPO | Source: Ambulatory Visit | Attending: Obstetrics and Gynecology | Admitting: Obstetrics and Gynecology

## 2019-04-27 DIAGNOSIS — N6322 Unspecified lump in the left breast, upper inner quadrant: Secondary | ICD-10-CM | POA: Diagnosis not present

## 2019-04-27 DIAGNOSIS — N63 Unspecified lump in unspecified breast: Secondary | ICD-10-CM

## 2019-04-27 DIAGNOSIS — R922 Inconclusive mammogram: Secondary | ICD-10-CM | POA: Diagnosis not present

## 2019-04-27 DIAGNOSIS — N6312 Unspecified lump in the right breast, upper inner quadrant: Secondary | ICD-10-CM | POA: Diagnosis not present

## 2019-05-09 DIAGNOSIS — Z23 Encounter for immunization: Secondary | ICD-10-CM | POA: Diagnosis not present

## 2019-05-09 DIAGNOSIS — Z0000 Encounter for general adult medical examination without abnormal findings: Secondary | ICD-10-CM | POA: Diagnosis not present

## 2019-05-09 DIAGNOSIS — M810 Age-related osteoporosis without current pathological fracture: Secondary | ICD-10-CM | POA: Diagnosis not present

## 2019-05-16 DIAGNOSIS — Z01419 Encounter for gynecological examination (general) (routine) without abnormal findings: Secondary | ICD-10-CM | POA: Diagnosis not present

## 2019-05-16 DIAGNOSIS — Z6821 Body mass index (BMI) 21.0-21.9, adult: Secondary | ICD-10-CM | POA: Diagnosis not present

## 2019-05-16 DIAGNOSIS — R87612 Low grade squamous intraepithelial lesion on cytologic smear of cervix (LGSIL): Secondary | ICD-10-CM | POA: Diagnosis not present

## 2019-08-03 DIAGNOSIS — D1801 Hemangioma of skin and subcutaneous tissue: Secondary | ICD-10-CM | POA: Diagnosis not present

## 2019-08-03 DIAGNOSIS — C44319 Basal cell carcinoma of skin of other parts of face: Secondary | ICD-10-CM | POA: Diagnosis not present

## 2019-08-03 DIAGNOSIS — D2271 Melanocytic nevi of right lower limb, including hip: Secondary | ICD-10-CM | POA: Diagnosis not present

## 2019-08-03 DIAGNOSIS — D2371 Other benign neoplasm of skin of right lower limb, including hip: Secondary | ICD-10-CM | POA: Diagnosis not present

## 2019-08-03 DIAGNOSIS — D225 Melanocytic nevi of trunk: Secondary | ICD-10-CM | POA: Diagnosis not present

## 2019-08-07 ENCOUNTER — Other Ambulatory Visit: Payer: Self-pay | Admitting: Obstetrics and Gynecology

## 2019-08-07 DIAGNOSIS — N6001 Solitary cyst of right breast: Secondary | ICD-10-CM

## 2019-08-14 ENCOUNTER — Other Ambulatory Visit: Payer: Self-pay | Admitting: Obstetrics and Gynecology

## 2019-08-14 ENCOUNTER — Other Ambulatory Visit: Payer: Self-pay

## 2019-08-14 ENCOUNTER — Ambulatory Visit
Admission: RE | Admit: 2019-08-14 | Discharge: 2019-08-14 | Disposition: A | Discharge status: Home / Self Care | Payer: BC Managed Care – PPO | Source: Ambulatory Visit | Attending: Obstetrics and Gynecology | Admitting: Obstetrics and Gynecology

## 2019-08-14 DIAGNOSIS — R922 Inconclusive mammogram: Secondary | ICD-10-CM | POA: Diagnosis not present

## 2019-08-14 DIAGNOSIS — N6001 Solitary cyst of right breast: Secondary | ICD-10-CM

## 2019-08-20 ENCOUNTER — Ambulatory Visit
Admission: RE | Admit: 2019-08-20 | Discharge: 2019-08-20 | Disposition: A | Discharge status: Home / Self Care | Payer: BC Managed Care – PPO | Source: Ambulatory Visit | Attending: Obstetrics and Gynecology | Admitting: Obstetrics and Gynecology

## 2019-08-20 ENCOUNTER — Other Ambulatory Visit: Payer: Self-pay

## 2019-08-20 DIAGNOSIS — N6001 Solitary cyst of right breast: Secondary | ICD-10-CM | POA: Diagnosis not present

## 2019-08-20 HISTORY — PX: BREAST CYST ASPIRATION: SHX578

## 2019-08-30 DIAGNOSIS — C44319 Basal cell carcinoma of skin of other parts of face: Secondary | ICD-10-CM | POA: Diagnosis not present

## 2020-03-19 ENCOUNTER — Other Ambulatory Visit: Payer: Self-pay | Admitting: Obstetrics and Gynecology

## 2020-03-19 DIAGNOSIS — Z1231 Encounter for screening mammogram for malignant neoplasm of breast: Secondary | ICD-10-CM

## 2020-05-01 ENCOUNTER — Ambulatory Visit
Admission: RE | Admit: 2020-05-01 | Discharge: 2020-05-01 | Disposition: A | Discharge status: Home / Self Care | Payer: BC Managed Care – PPO | Source: Ambulatory Visit | Attending: Obstetrics and Gynecology | Admitting: Obstetrics and Gynecology

## 2020-05-01 ENCOUNTER — Other Ambulatory Visit: Payer: Self-pay

## 2020-05-01 DIAGNOSIS — Z1231 Encounter for screening mammogram for malignant neoplasm of breast: Secondary | ICD-10-CM | POA: Diagnosis not present

## 2020-05-08 DIAGNOSIS — M810 Age-related osteoporosis without current pathological fracture: Secondary | ICD-10-CM | POA: Diagnosis not present

## 2020-05-08 DIAGNOSIS — Z13220 Encounter for screening for lipoid disorders: Secondary | ICD-10-CM | POA: Diagnosis not present

## 2020-05-08 DIAGNOSIS — E559 Vitamin D deficiency, unspecified: Secondary | ICD-10-CM | POA: Diagnosis not present

## 2020-05-08 DIAGNOSIS — Z0000 Encounter for general adult medical examination without abnormal findings: Secondary | ICD-10-CM | POA: Diagnosis not present

## 2020-05-15 DIAGNOSIS — Z0000 Encounter for general adult medical examination without abnormal findings: Secondary | ICD-10-CM | POA: Diagnosis not present

## 2020-05-15 DIAGNOSIS — M810 Age-related osteoporosis without current pathological fracture: Secondary | ICD-10-CM | POA: Diagnosis not present

## 2020-05-15 DIAGNOSIS — Z23 Encounter for immunization: Secondary | ICD-10-CM | POA: Diagnosis not present

## 2020-05-22 DIAGNOSIS — M810 Age-related osteoporosis without current pathological fracture: Secondary | ICD-10-CM | POA: Diagnosis not present

## 2020-05-22 DIAGNOSIS — E559 Vitamin D deficiency, unspecified: Secondary | ICD-10-CM | POA: Diagnosis not present

## 2020-05-23 DIAGNOSIS — Z01419 Encounter for gynecological examination (general) (routine) without abnormal findings: Secondary | ICD-10-CM | POA: Diagnosis not present

## 2020-05-23 DIAGNOSIS — N8189 Other female genital prolapse: Secondary | ICD-10-CM | POA: Diagnosis not present

## 2020-05-23 DIAGNOSIS — Z6822 Body mass index (BMI) 22.0-22.9, adult: Secondary | ICD-10-CM | POA: Diagnosis not present

## 2020-05-26 IMAGING — US US BREAST*L* LIMITED INC AXILLA
1 series · 9 of 9 positions shown · non-contrast
Comparison: Previous exam(s).

CLINICAL DATA: 53-year-old female presenting for evaluation of
bilateral palpable lumps. She has history cyst aspiration and a
biopsy in the left breast with pathology results indicating benign
ruptured cyst.

EXAM:
DIGITAL DIAGNOSTIC BILATERAL MAMMOGRAM WITH CAD AND TOMO
ULTRASOUND BILATERAL BREAST

[Series 1: us breast*left* limited inc axilla · 0.07mm/px · 9 of 9 slices shown]
[im 1/9]
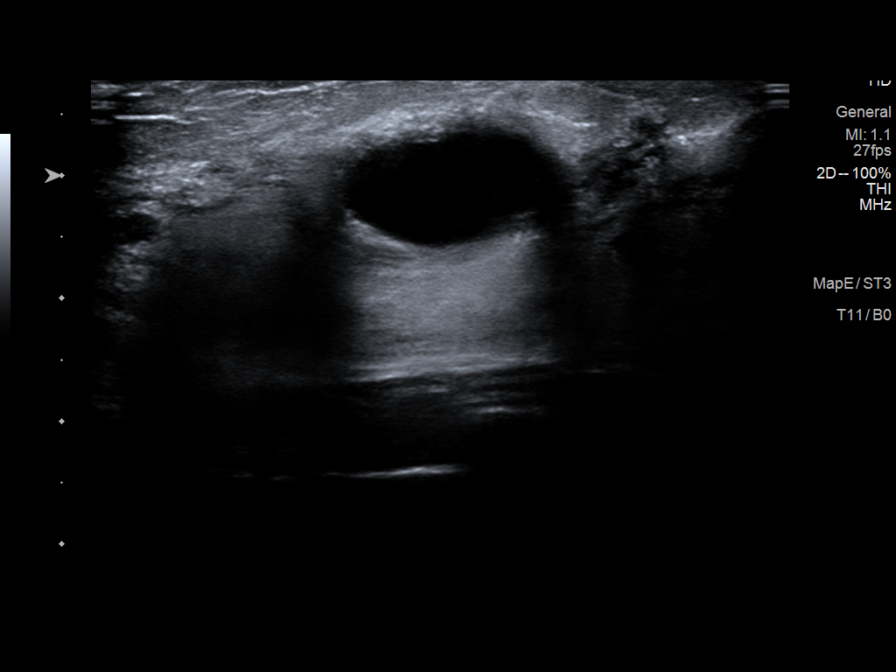
[im 2/9]
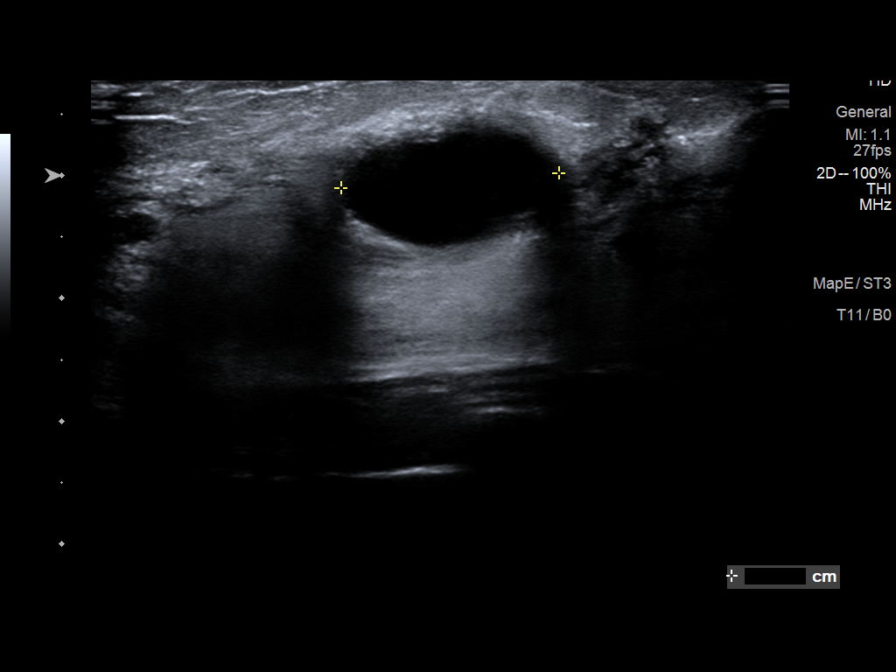
[im 3/9]
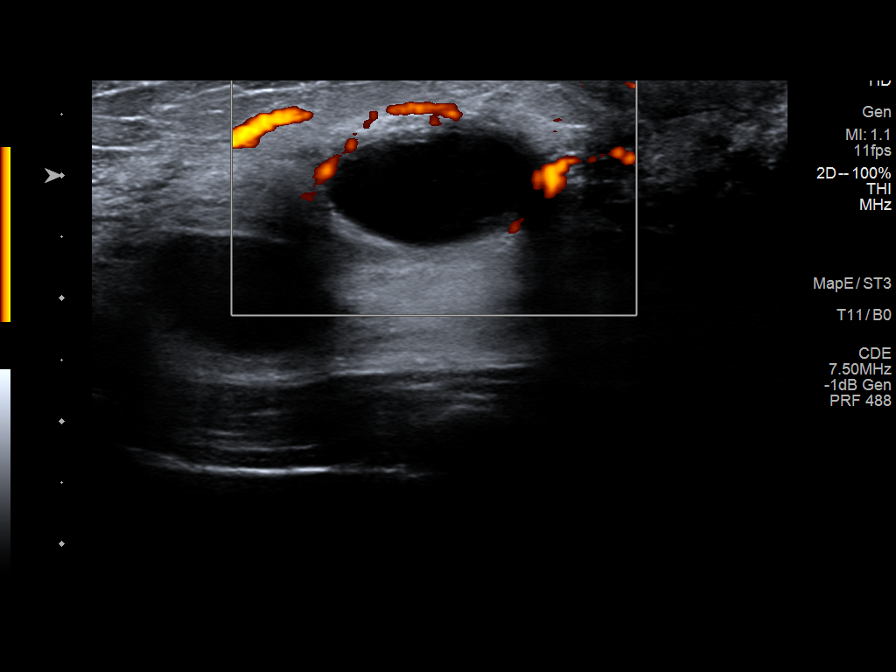
[im 4/9]
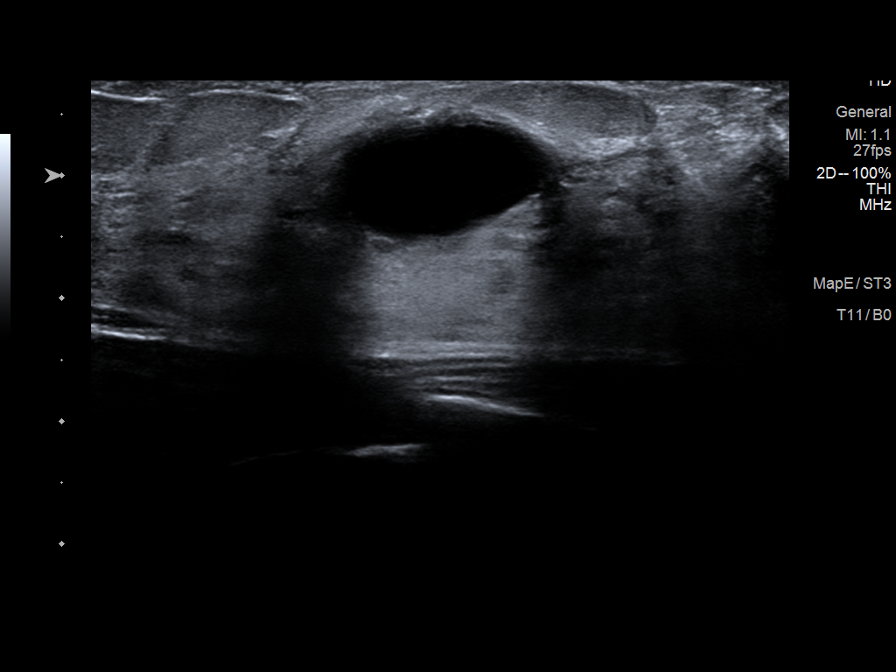
[im 5/9]
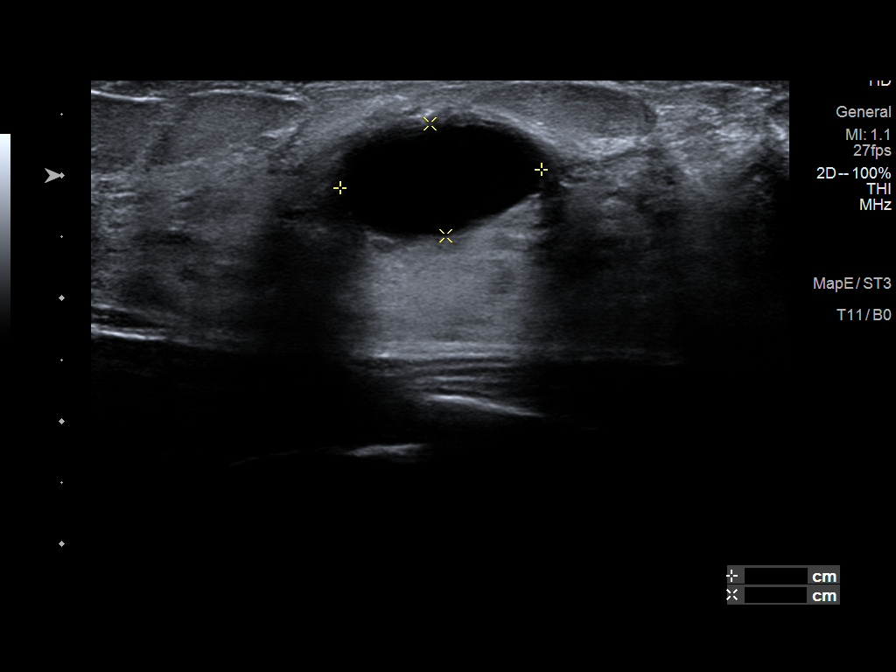
[im 6/9]
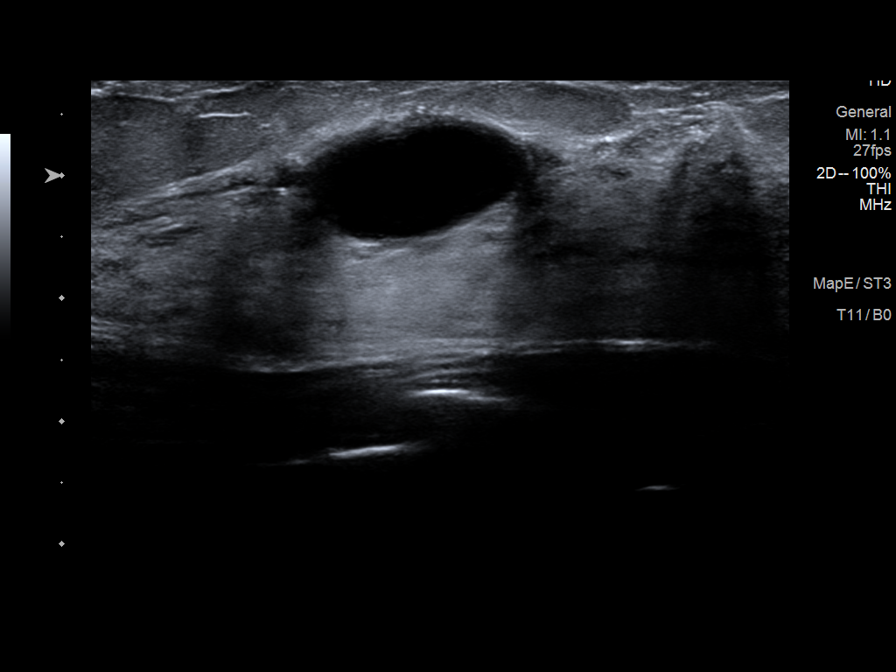
[im 7/9]
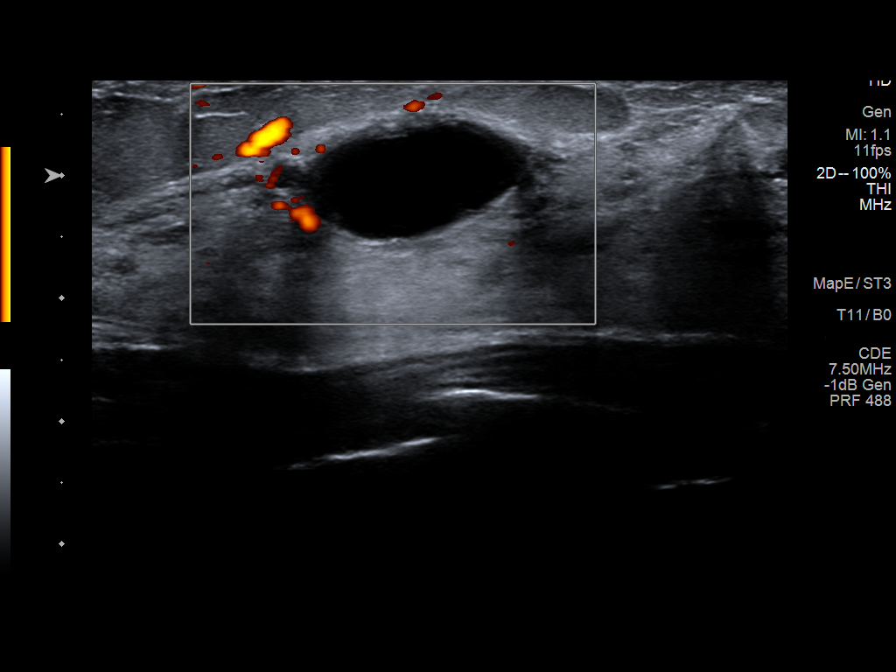
[im 8/9]
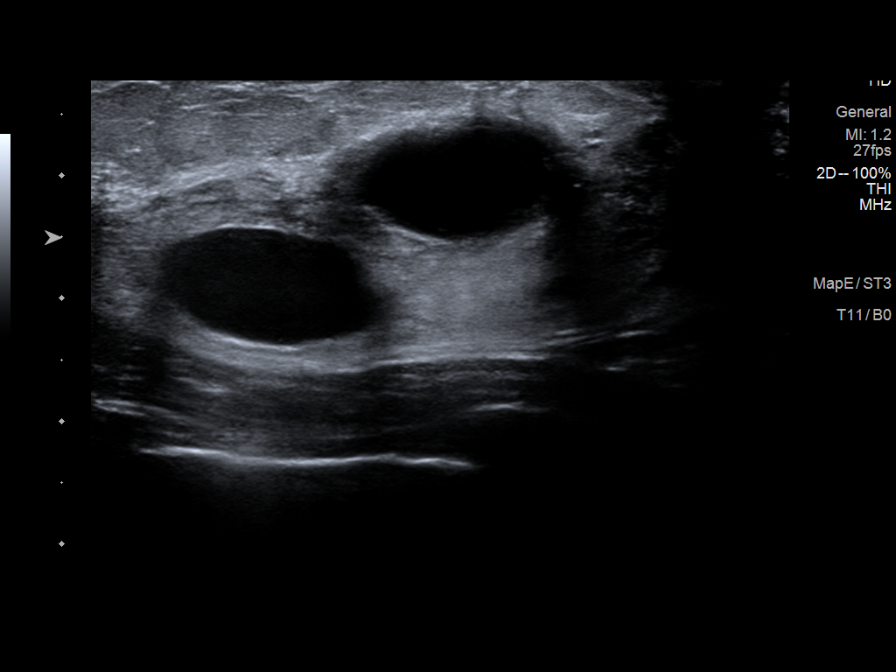
[im 9/9]
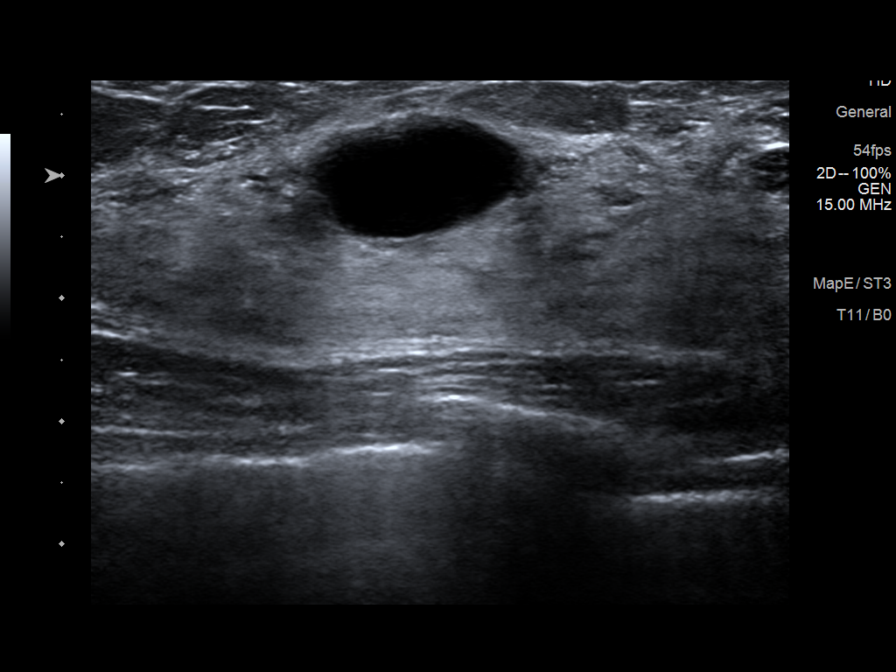

[9 of 9 positions shown; findings below may reference images not displayed]

ACR Breast Density Category d: The breast tissue is extremely dense,
which lowers the sensitivity of mammography.
FINDINGS: A BB has been placed on the medial anterior right breast indicating
the palpable site of concern. Deep to this marker there is a large
obscured mass measuring at least 4 cm. Another BB has been placed
adjacent to the left nipple. Several obscured masses are seen deep
to this marker. The visualized margins are circumscribed. No other
suspicious calcifications, masses or areas of distortion are seen in
the bilateral breasts.

Mammographic images were processed with CAD.

Ultrasound of the right breast at 2 o'clock, 3 cm from the nipple at
the palpable site demonstrates multiple anechoic circumscribed
masses consistent with benign cysts. The largest measures 2.9 cm.

Ultrasound of the palpable site in the left breast at 11 o'clock, 1
cm from the nipple demonstrates multiple anechoic oval circumscribed
masses, the largest measuring 1.8 cm.
IMPRESSION: 1.  The palpable sites in both breasts demonstrate benign cysts.

2.  No mammographic evidence of malignancy in the bilateral breasts.

RECOMMENDATION:
Screening mammogram in one year.(Code:FX-G-89U)

I have discussed the findings and recommendations with the patient.
If applicable, a reminder letter will be sent to the patient
regarding the next appointment.

BI-RADS CATEGORY  2: Benign.

## 2020-06-09 DIAGNOSIS — M810 Age-related osteoporosis without current pathological fracture: Secondary | ICD-10-CM | POA: Diagnosis not present

## 2020-07-28 DIAGNOSIS — D2261 Melanocytic nevi of right upper limb, including shoulder: Secondary | ICD-10-CM | POA: Diagnosis not present

## 2020-07-28 DIAGNOSIS — L239 Allergic contact dermatitis, unspecified cause: Secondary | ICD-10-CM | POA: Diagnosis not present

## 2020-07-28 DIAGNOSIS — D2262 Melanocytic nevi of left upper limb, including shoulder: Secondary | ICD-10-CM | POA: Diagnosis not present

## 2020-07-28 DIAGNOSIS — Z85828 Personal history of other malignant neoplasm of skin: Secondary | ICD-10-CM | POA: Diagnosis not present

## 2020-12-12 DIAGNOSIS — M810 Age-related osteoporosis without current pathological fracture: Secondary | ICD-10-CM | POA: Diagnosis not present

## 2021-03-25 ENCOUNTER — Other Ambulatory Visit: Payer: Self-pay | Admitting: Obstetrics and Gynecology

## 2021-03-25 DIAGNOSIS — Z1231 Encounter for screening mammogram for malignant neoplasm of breast: Secondary | ICD-10-CM

## 2021-04-16 DIAGNOSIS — L243 Irritant contact dermatitis due to cosmetics: Secondary | ICD-10-CM | POA: Diagnosis not present

## 2021-04-16 DIAGNOSIS — L700 Acne vulgaris: Secondary | ICD-10-CM | POA: Diagnosis not present

## 2021-05-04 ENCOUNTER — Other Ambulatory Visit: Payer: Self-pay

## 2021-05-04 ENCOUNTER — Ambulatory Visit
Admission: RE | Admit: 2021-05-04 | Discharge: 2021-05-04 | Disposition: A | Discharge status: Home / Self Care | Payer: BC Managed Care – PPO | Source: Ambulatory Visit | Attending: Obstetrics and Gynecology | Admitting: Obstetrics and Gynecology

## 2021-05-04 DIAGNOSIS — Z1231 Encounter for screening mammogram for malignant neoplasm of breast: Secondary | ICD-10-CM | POA: Diagnosis not present

## 2021-05-21 DIAGNOSIS — Z0000 Encounter for general adult medical examination without abnormal findings: Secondary | ICD-10-CM | POA: Diagnosis not present

## 2021-05-21 DIAGNOSIS — E559 Vitamin D deficiency, unspecified: Secondary | ICD-10-CM | POA: Diagnosis not present

## 2021-05-26 DIAGNOSIS — Z6821 Body mass index (BMI) 21.0-21.9, adult: Secondary | ICD-10-CM | POA: Diagnosis not present

## 2021-05-26 DIAGNOSIS — Z01419 Encounter for gynecological examination (general) (routine) without abnormal findings: Secondary | ICD-10-CM | POA: Diagnosis not present

## 2021-05-28 DIAGNOSIS — E559 Vitamin D deficiency, unspecified: Secondary | ICD-10-CM | POA: Diagnosis not present

## 2021-05-28 DIAGNOSIS — Z0000 Encounter for general adult medical examination without abnormal findings: Secondary | ICD-10-CM | POA: Diagnosis not present

## 2021-05-28 DIAGNOSIS — M810 Age-related osteoporosis without current pathological fracture: Secondary | ICD-10-CM | POA: Diagnosis not present

## 2021-05-28 DIAGNOSIS — Z23 Encounter for immunization: Secondary | ICD-10-CM | POA: Diagnosis not present

## 2021-06-17 DIAGNOSIS — M810 Age-related osteoporosis without current pathological fracture: Secondary | ICD-10-CM | POA: Diagnosis not present

## 2021-06-26 DIAGNOSIS — L720 Epidermal cyst: Secondary | ICD-10-CM | POA: Diagnosis not present

## 2021-06-26 DIAGNOSIS — D2271 Melanocytic nevi of right lower limb, including hip: Secondary | ICD-10-CM | POA: Diagnosis not present

## 2021-06-26 DIAGNOSIS — Z85828 Personal history of other malignant neoplasm of skin: Secondary | ICD-10-CM | POA: Diagnosis not present

## 2021-06-26 DIAGNOSIS — L570 Actinic keratosis: Secondary | ICD-10-CM | POA: Diagnosis not present

## 2021-06-26 DIAGNOSIS — D225 Melanocytic nevi of trunk: Secondary | ICD-10-CM | POA: Diagnosis not present

## 2021-08-31 DIAGNOSIS — N819 Female genital prolapse, unspecified: Secondary | ICD-10-CM | POA: Diagnosis not present

## 2021-09-16 DIAGNOSIS — N819 Female genital prolapse, unspecified: Secondary | ICD-10-CM | POA: Diagnosis not present

## 2021-12-16 DIAGNOSIS — M810 Age-related osteoporosis without current pathological fracture: Secondary | ICD-10-CM | POA: Diagnosis not present

## 2022-01-13 DIAGNOSIS — E790 Hyperuricemia without signs of inflammatory arthritis and tophaceous disease: Secondary | ICD-10-CM | POA: Diagnosis not present

## 2022-01-13 DIAGNOSIS — B3789 Other sites of candidiasis: Secondary | ICD-10-CM | POA: Diagnosis not present

## 2022-01-13 DIAGNOSIS — E782 Mixed hyperlipidemia: Secondary | ICD-10-CM | POA: Diagnosis not present

## 2022-01-13 DIAGNOSIS — E8881 Metabolic syndrome: Secondary | ICD-10-CM | POA: Diagnosis not present

## 2022-01-13 DIAGNOSIS — E7212 Methylenetetrahydrofolate reductase deficiency: Secondary | ICD-10-CM | POA: Diagnosis not present

## 2022-01-13 DIAGNOSIS — E039 Hypothyroidism, unspecified: Secondary | ICD-10-CM | POA: Diagnosis not present

## 2022-01-13 DIAGNOSIS — Z7689 Persons encountering health services in other specified circumstances: Secondary | ICD-10-CM | POA: Diagnosis not present

## 2022-03-29 ENCOUNTER — Other Ambulatory Visit: Payer: Self-pay | Admitting: Obstetrics and Gynecology

## 2022-03-29 DIAGNOSIS — Z1231 Encounter for screening mammogram for malignant neoplasm of breast: Secondary | ICD-10-CM

## 2022-05-06 ENCOUNTER — Ambulatory Visit
Admission: RE | Admit: 2022-05-06 | Discharge: 2022-05-06 | Disposition: A | Discharge status: Home / Self Care | Payer: BC Managed Care – PPO | Source: Ambulatory Visit | Attending: Obstetrics and Gynecology | Admitting: Obstetrics and Gynecology

## 2022-05-06 DIAGNOSIS — Z1231 Encounter for screening mammogram for malignant neoplasm of breast: Secondary | ICD-10-CM | POA: Diagnosis not present

## 2022-05-06 DIAGNOSIS — D0461 Carcinoma in situ of skin of right upper limb, including shoulder: Secondary | ICD-10-CM | POA: Diagnosis not present

## 2022-05-27 DIAGNOSIS — Z6821 Body mass index (BMI) 21.0-21.9, adult: Secondary | ICD-10-CM | POA: Diagnosis not present

## 2022-05-27 DIAGNOSIS — D0461 Carcinoma in situ of skin of right upper limb, including shoulder: Secondary | ICD-10-CM | POA: Diagnosis not present

## 2022-05-27 DIAGNOSIS — Z1151 Encounter for screening for human papillomavirus (HPV): Secondary | ICD-10-CM | POA: Diagnosis not present

## 2022-05-27 DIAGNOSIS — R002 Palpitations: Secondary | ICD-10-CM | POA: Diagnosis not present

## 2022-05-27 DIAGNOSIS — Z01419 Encounter for gynecological examination (general) (routine) without abnormal findings: Secondary | ICD-10-CM | POA: Diagnosis not present

## 2022-05-27 DIAGNOSIS — Z124 Encounter for screening for malignant neoplasm of cervix: Secondary | ICD-10-CM | POA: Diagnosis not present

## 2022-05-27 DIAGNOSIS — E559 Vitamin D deficiency, unspecified: Secondary | ICD-10-CM | POA: Diagnosis not present

## 2022-05-27 DIAGNOSIS — Z0000 Encounter for general adult medical examination without abnormal findings: Secondary | ICD-10-CM | POA: Diagnosis not present

## 2022-05-27 DIAGNOSIS — Z780 Asymptomatic menopausal state: Secondary | ICD-10-CM | POA: Diagnosis not present

## 2022-06-03 DIAGNOSIS — M810 Age-related osteoporosis without current pathological fracture: Secondary | ICD-10-CM | POA: Diagnosis not present

## 2022-06-03 IMAGING — MG MM DIGITAL SCREENING BILAT W/ TOMO AND CAD
8 series · 9 of 24 positions shown · non-contrast
Comparison: Previous exam(s).

CLINICAL DATA: Screening.

EXAM:
DIGITAL SCREENING BILATERAL MAMMOGRAM WITH TOMOSYNTHESIS AND CAD
TECHNIQUE: Bilateral screening digital craniocaudal and mediolateral oblique
mammograms were obtained. Bilateral screening digital breast
tomosynthesis was performed. The images were evaluated with
computer-aided detection.

[R MLO synth-2D]
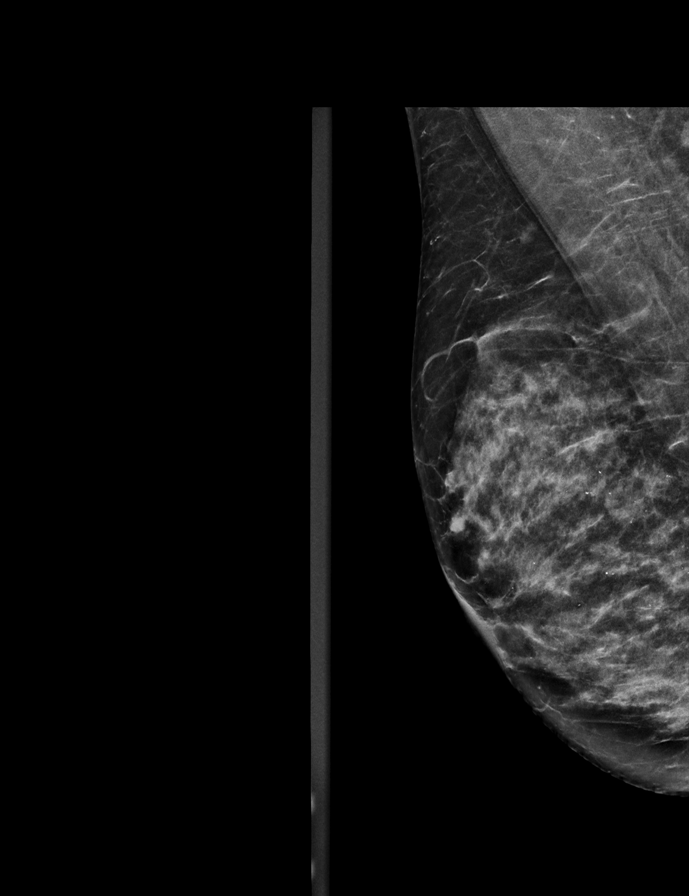

[R CC synth-2D]
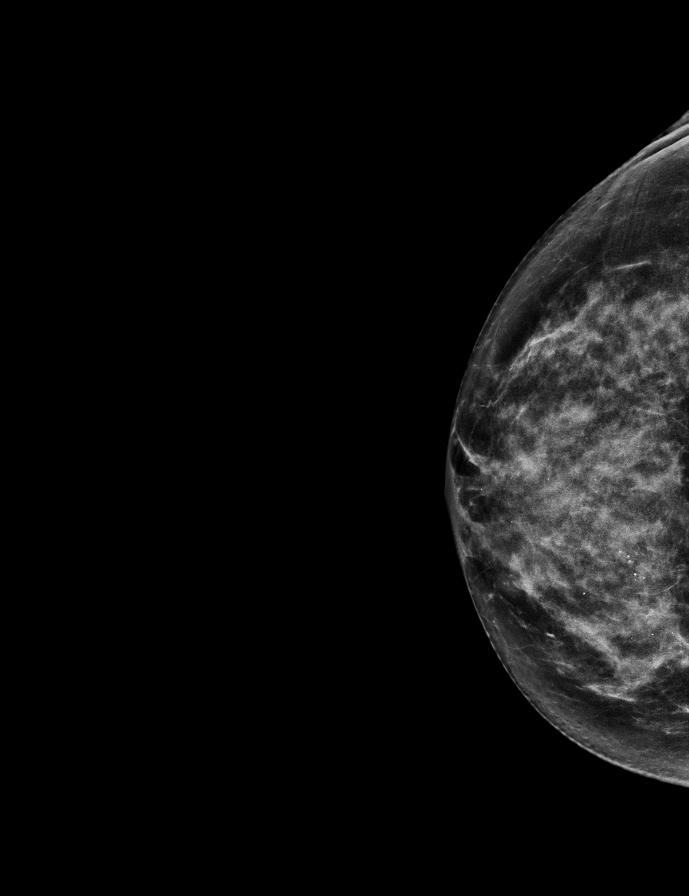

[L CC synth-2D]
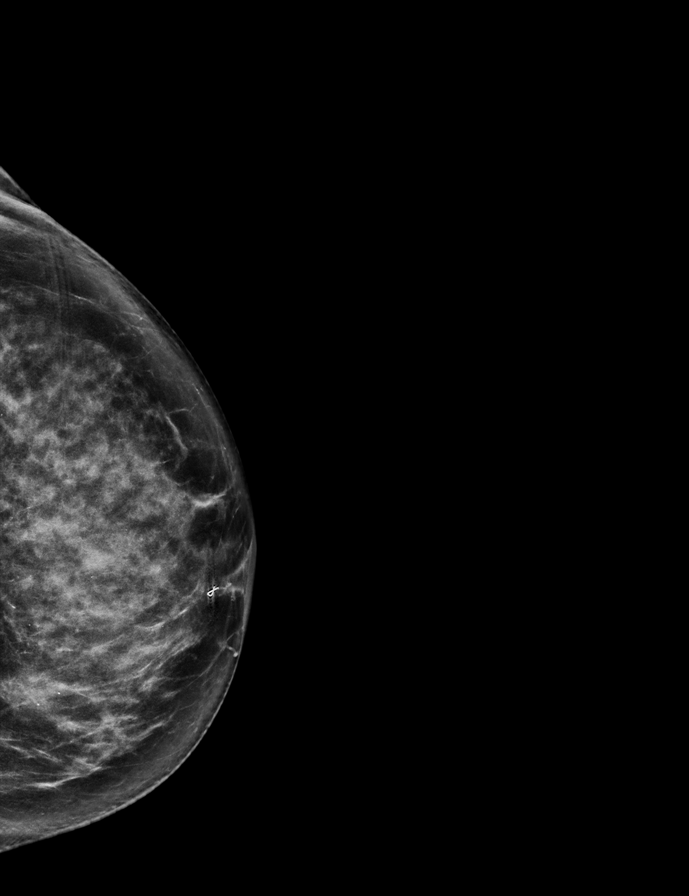

[L MLO synth-2D]
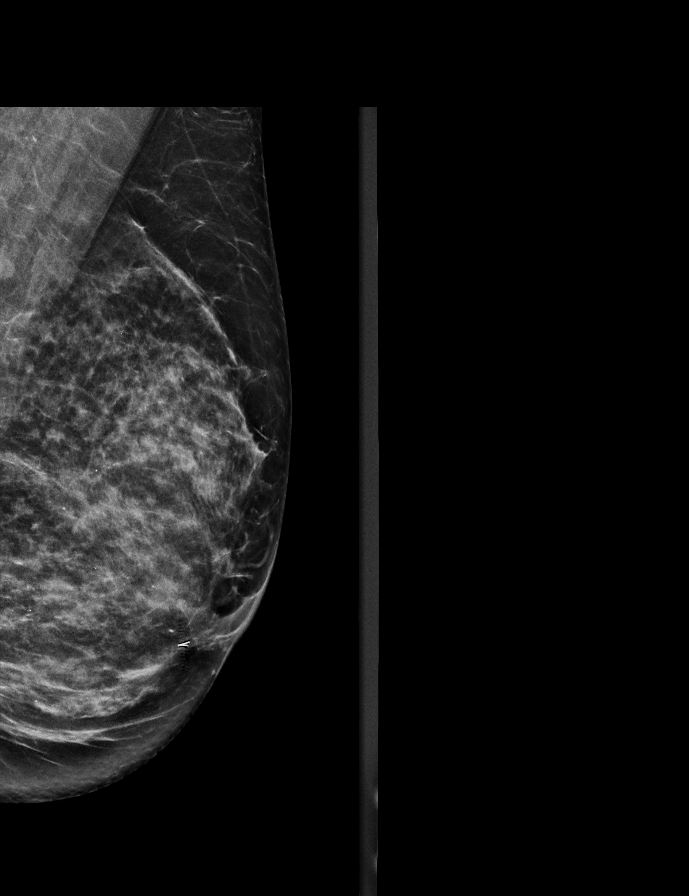

[R MLO tomo · 2 of 56 frames shown]
[frame 19/56]
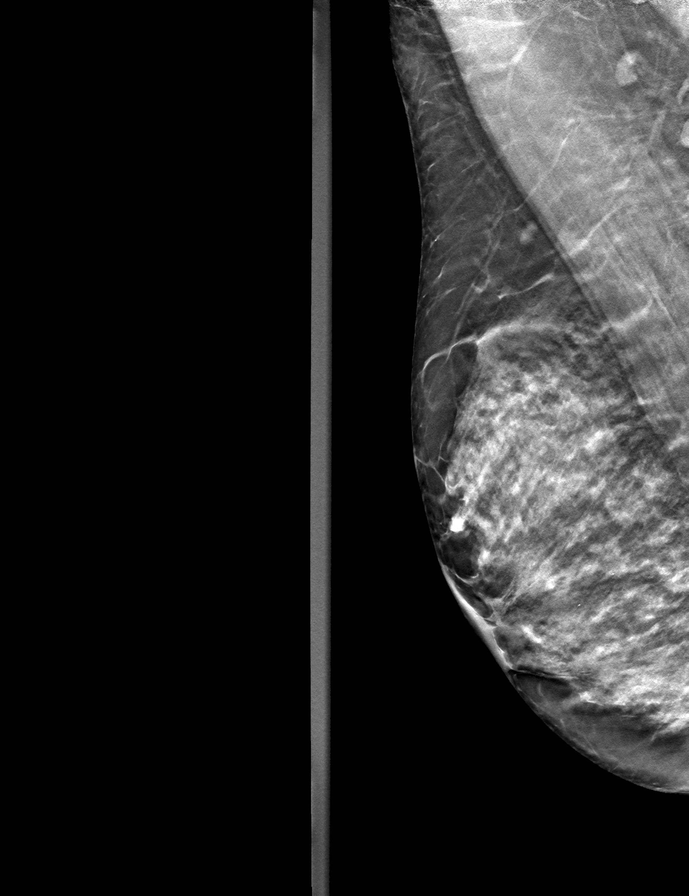
[frame 29/56]
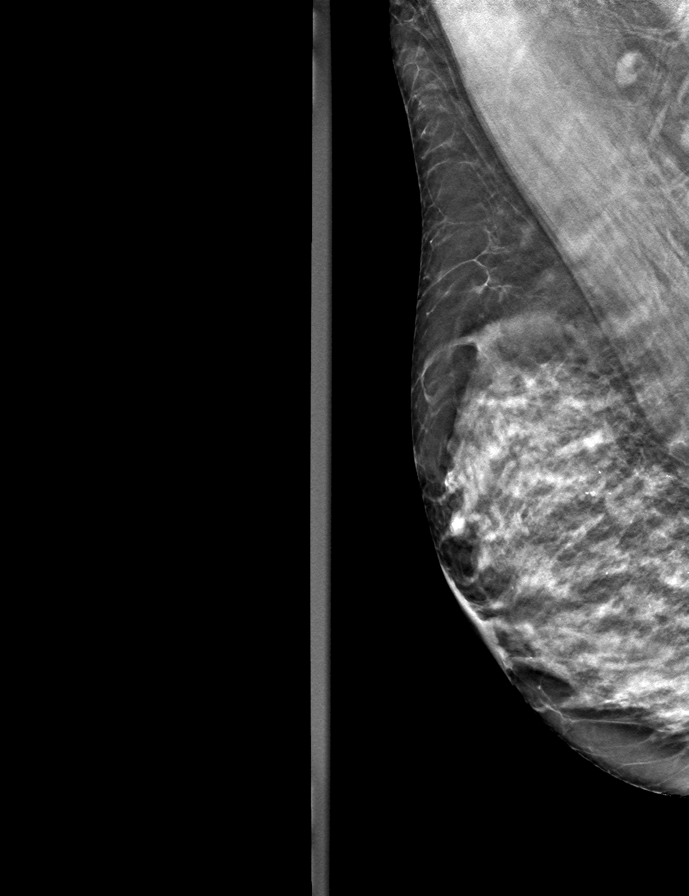

[L MLO tomo · tomo slice 29/58.0]
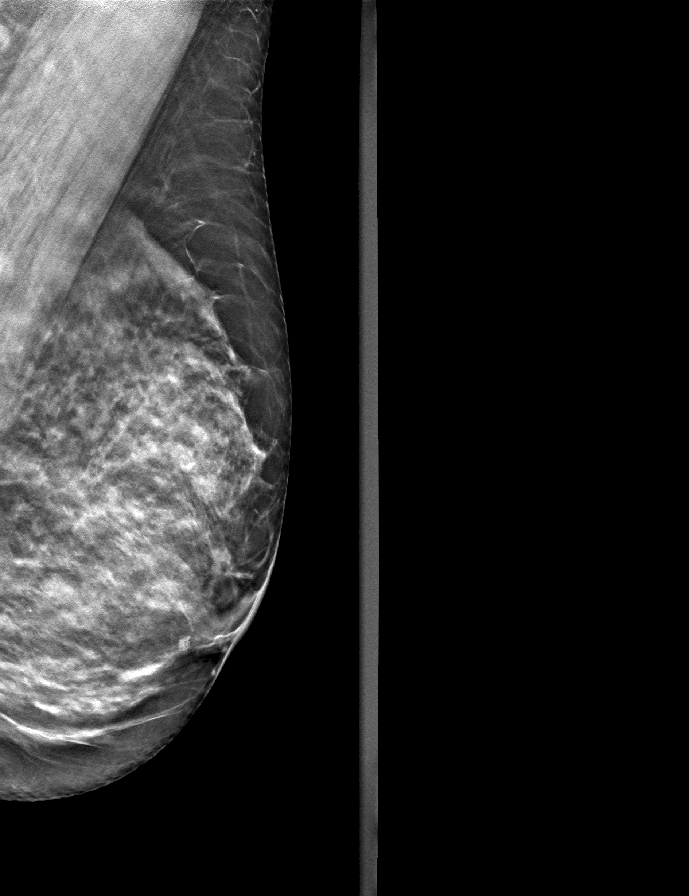

[R CC tomo · tomo slice 31/60.0]
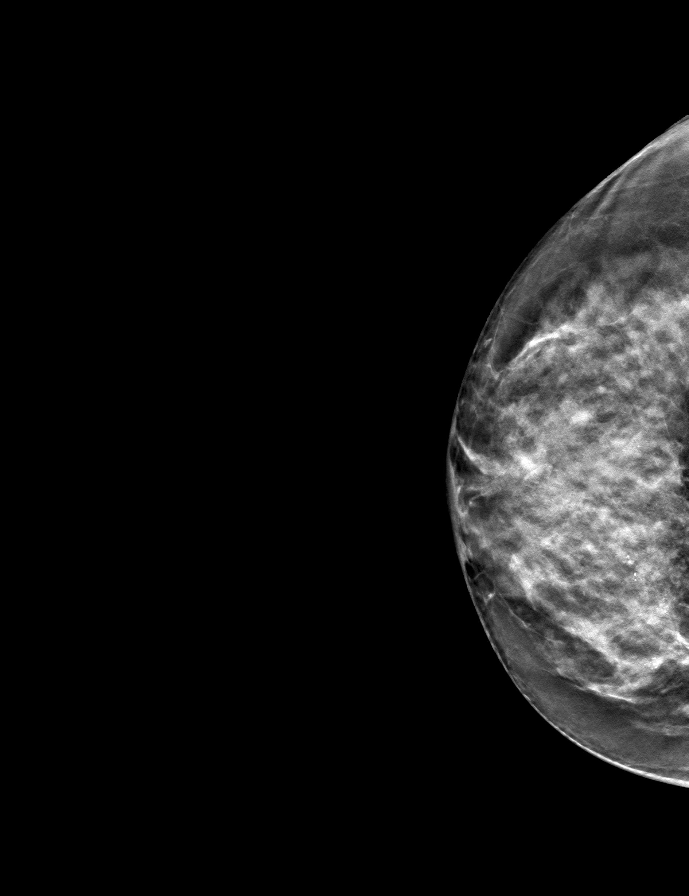

[L CC tomo · tomo slice 35/68.0]
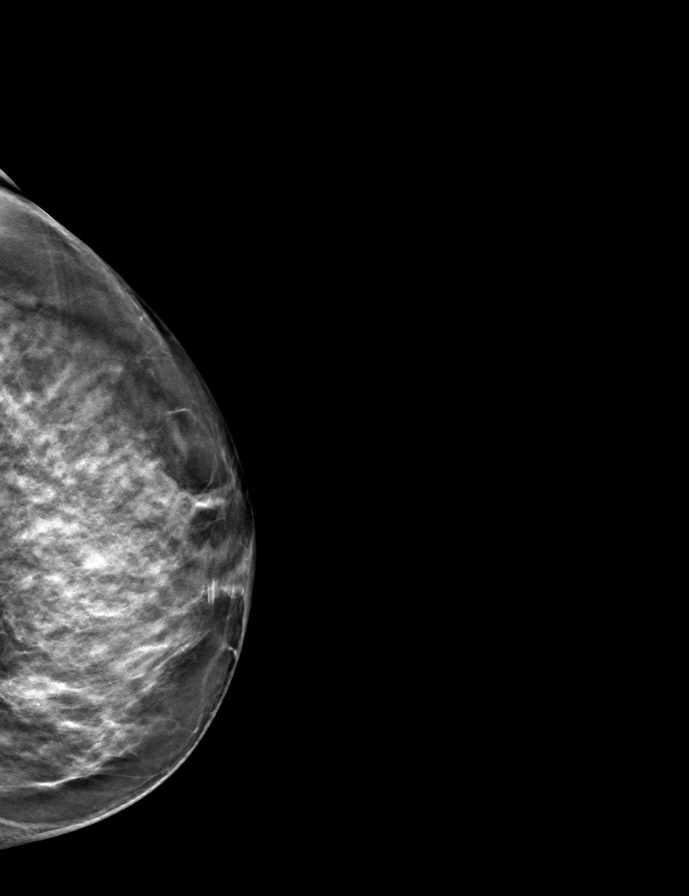

[9 of 24 positions shown; findings below may reference images not displayed]

ACR Breast Density Category d: The breast tissue is extremely dense,
which lowers the sensitivity of mammography
FINDINGS: There are no findings suspicious for malignancy.
IMPRESSION: No mammographic evidence of malignancy. A result letter of this
screening mammogram will be mailed directly to the patient.

RECOMMENDATION:
Screening mammogram in one year. (Code:[id number])

BI-RADS CATEGORY  1: Negative.

## 2022-06-14 DIAGNOSIS — R87610 Atypical squamous cells of undetermined significance on cytologic smear of cervix (ASC-US): Secondary | ICD-10-CM | POA: Diagnosis not present

## 2022-06-14 DIAGNOSIS — N870 Mild cervical dysplasia: Secondary | ICD-10-CM | POA: Diagnosis not present

## 2022-06-29 DIAGNOSIS — M810 Age-related osteoporosis without current pathological fracture: Secondary | ICD-10-CM | POA: Diagnosis not present

## 2022-07-14 ENCOUNTER — Other Ambulatory Visit (HOSPITAL_COMMUNITY): Payer: Self-pay | Admitting: Registered Nurse

## 2022-07-14 DIAGNOSIS — Z0000 Encounter for general adult medical examination without abnormal findings: Secondary | ICD-10-CM

## 2022-07-14 DIAGNOSIS — Z23 Encounter for immunization: Secondary | ICD-10-CM | POA: Diagnosis not present

## 2022-07-14 DIAGNOSIS — Z1389 Encounter for screening for other disorder: Secondary | ICD-10-CM | POA: Diagnosis not present

## 2022-07-26 DIAGNOSIS — Z85828 Personal history of other malignant neoplasm of skin: Secondary | ICD-10-CM | POA: Diagnosis not present

## 2022-07-26 DIAGNOSIS — D225 Melanocytic nevi of trunk: Secondary | ICD-10-CM | POA: Diagnosis not present

## 2022-07-26 DIAGNOSIS — L814 Other melanin hyperpigmentation: Secondary | ICD-10-CM | POA: Diagnosis not present

## 2022-07-26 DIAGNOSIS — D2262 Melanocytic nevi of left upper limb, including shoulder: Secondary | ICD-10-CM | POA: Diagnosis not present

## 2022-07-26 DIAGNOSIS — L820 Inflamed seborrheic keratosis: Secondary | ICD-10-CM | POA: Diagnosis not present

## 2022-07-27 ENCOUNTER — Ambulatory Visit (HOSPITAL_COMMUNITY)
Admission: RE | Admit: 2022-07-27 | Discharge: 2022-07-27 | Disposition: A | Discharge status: Home / Self Care | Payer: BC Managed Care – PPO | Source: Ambulatory Visit | Attending: Registered Nurse | Admitting: Registered Nurse

## 2022-07-27 DIAGNOSIS — Z0000 Encounter for general adult medical examination without abnormal findings: Secondary | ICD-10-CM

## 2022-11-01 DIAGNOSIS — M79644 Pain in right finger(s): Secondary | ICD-10-CM | POA: Diagnosis not present

## 2022-11-01 DIAGNOSIS — S62636A Displaced fracture of distal phalanx of right little finger, initial encounter for closed fracture: Secondary | ICD-10-CM | POA: Diagnosis not present

## 2022-11-01 DIAGNOSIS — S62634A Displaced fracture of distal phalanx of right ring finger, initial encounter for closed fracture: Secondary | ICD-10-CM | POA: Diagnosis not present

## 2022-12-30 DIAGNOSIS — M810 Age-related osteoporosis without current pathological fracture: Secondary | ICD-10-CM | POA: Diagnosis not present

## 2023-03-30 ENCOUNTER — Other Ambulatory Visit: Payer: Self-pay | Admitting: Obstetrics and Gynecology

## 2023-03-30 DIAGNOSIS — Z1231 Encounter for screening mammogram for malignant neoplasm of breast: Secondary | ICD-10-CM

## 2023-05-05 DIAGNOSIS — L820 Inflamed seborrheic keratosis: Secondary | ICD-10-CM | POA: Diagnosis not present

## 2023-05-10 ENCOUNTER — Ambulatory Visit
Admission: RE | Admit: 2023-05-10 | Discharge: 2023-05-10 | Disposition: A | Discharge status: Home / Self Care | Payer: BC Managed Care – PPO | Source: Ambulatory Visit | Attending: Obstetrics and Gynecology | Admitting: Obstetrics and Gynecology

## 2023-05-10 DIAGNOSIS — Z1231 Encounter for screening mammogram for malignant neoplasm of breast: Secondary | ICD-10-CM

## 2023-06-02 DIAGNOSIS — Z124 Encounter for screening for malignant neoplasm of cervix: Secondary | ICD-10-CM | POA: Diagnosis not present

## 2023-06-02 DIAGNOSIS — Z6823 Body mass index (BMI) 23.0-23.9, adult: Secondary | ICD-10-CM | POA: Diagnosis not present

## 2023-06-02 DIAGNOSIS — Z1151 Encounter for screening for human papillomavirus (HPV): Secondary | ICD-10-CM | POA: Diagnosis not present

## 2023-06-02 DIAGNOSIS — Z01419 Encounter for gynecological examination (general) (routine) without abnormal findings: Secondary | ICD-10-CM | POA: Diagnosis not present

## 2023-07-04 DIAGNOSIS — M810 Age-related osteoporosis without current pathological fracture: Secondary | ICD-10-CM | POA: Diagnosis not present

## 2023-07-14 DIAGNOSIS — E559 Vitamin D deficiency, unspecified: Secondary | ICD-10-CM | POA: Diagnosis not present

## 2023-07-14 DIAGNOSIS — Z0000 Encounter for general adult medical examination without abnormal findings: Secondary | ICD-10-CM | POA: Diagnosis not present

## 2023-07-21 DIAGNOSIS — Z23 Encounter for immunization: Secondary | ICD-10-CM | POA: Diagnosis not present

## 2023-07-21 DIAGNOSIS — Z0000 Encounter for general adult medical examination without abnormal findings: Secondary | ICD-10-CM | POA: Diagnosis not present

## 2023-09-01 DIAGNOSIS — Z85828 Personal history of other malignant neoplasm of skin: Secondary | ICD-10-CM | POA: Diagnosis not present

## 2023-09-01 DIAGNOSIS — L570 Actinic keratosis: Secondary | ICD-10-CM | POA: Diagnosis not present

## 2023-09-01 DIAGNOSIS — L821 Other seborrheic keratosis: Secondary | ICD-10-CM | POA: Diagnosis not present

## 2023-09-01 DIAGNOSIS — D1801 Hemangioma of skin and subcutaneous tissue: Secondary | ICD-10-CM | POA: Diagnosis not present

## 2023-09-01 DIAGNOSIS — L814 Other melanin hyperpigmentation: Secondary | ICD-10-CM | POA: Diagnosis not present

## 2023-10-12 DIAGNOSIS — R222 Localized swelling, mass and lump, trunk: Secondary | ICD-10-CM | POA: Diagnosis not present

## 2023-10-13 DIAGNOSIS — R222 Localized swelling, mass and lump, trunk: Secondary | ICD-10-CM | POA: Diagnosis not present

## 2023-10-13 DIAGNOSIS — R10815 Periumbilic abdominal tenderness: Secondary | ICD-10-CM | POA: Diagnosis not present

## 2023-10-19 ENCOUNTER — Other Ambulatory Visit: Payer: Self-pay | Admitting: Registered Nurse

## 2023-10-19 DIAGNOSIS — R222 Localized swelling, mass and lump, trunk: Secondary | ICD-10-CM

## 2023-11-02 ENCOUNTER — Encounter: Payer: Self-pay | Admitting: Registered Nurse

## 2023-11-08 ENCOUNTER — Ambulatory Visit
Admission: RE | Admit: 2023-11-08 | Discharge: 2023-11-08 | Disposition: A | Discharge status: Home / Self Care | Source: Ambulatory Visit | Attending: Registered Nurse | Admitting: Registered Nurse

## 2023-11-08 DIAGNOSIS — R222 Localized swelling, mass and lump, trunk: Secondary | ICD-10-CM

## 2023-11-08 DIAGNOSIS — R1909 Other intra-abdominal and pelvic swelling, mass and lump: Secondary | ICD-10-CM | POA: Diagnosis not present

## 2023-11-08 MED ORDER — GADOPICLENOL 0.5 MMOL/ML IV SOLN
8.0000 mL | Freq: Once | INTRAVENOUS | Status: AC | PRN
  Administered 2023-11-08: 8 mL via INTRAVENOUS

## 2024-01-12 DIAGNOSIS — M810 Age-related osteoporosis without current pathological fracture: Secondary | ICD-10-CM | POA: Diagnosis not present

## 2024-02-08 DIAGNOSIS — C4339 Malignant melanoma of other parts of face: Secondary | ICD-10-CM | POA: Diagnosis not present

## 2024-03-15 DIAGNOSIS — Z85828 Personal history of other malignant neoplasm of skin: Secondary | ICD-10-CM | POA: Diagnosis not present

## 2024-03-15 DIAGNOSIS — Z08 Encounter for follow-up examination after completed treatment for malignant neoplasm: Secondary | ICD-10-CM | POA: Diagnosis not present

## 2024-04-03 ENCOUNTER — Other Ambulatory Visit: Payer: Self-pay | Admitting: Obstetrics and Gynecology

## 2024-04-03 DIAGNOSIS — Z1231 Encounter for screening mammogram for malignant neoplasm of breast: Secondary | ICD-10-CM

## 2024-05-11 ENCOUNTER — Ambulatory Visit
Admission: RE | Admit: 2024-05-11 | Discharge: 2024-05-11 | Disposition: A | Discharge status: Home / Self Care | Source: Ambulatory Visit | Attending: Obstetrics and Gynecology | Admitting: Obstetrics and Gynecology

## 2024-05-11 DIAGNOSIS — Z1231 Encounter for screening mammogram for malignant neoplasm of breast: Secondary | ICD-10-CM | POA: Diagnosis not present

## 2024-05-15 DIAGNOSIS — Z85828 Personal history of other malignant neoplasm of skin: Secondary | ICD-10-CM | POA: Diagnosis not present

## 2024-06-05 DIAGNOSIS — Z6823 Body mass index (BMI) 23.0-23.9, adult: Secondary | ICD-10-CM | POA: Diagnosis not present

## 2024-06-05 DIAGNOSIS — Z1151 Encounter for screening for human papillomavirus (HPV): Secondary | ICD-10-CM | POA: Diagnosis not present

## 2024-06-05 DIAGNOSIS — Z01419 Encounter for gynecological examination (general) (routine) without abnormal findings: Secondary | ICD-10-CM | POA: Diagnosis not present

## 2024-06-05 DIAGNOSIS — R87810 Cervical high risk human papillomavirus (HPV) DNA test positive: Secondary | ICD-10-CM | POA: Diagnosis not present

## 2024-06-05 DIAGNOSIS — Z124 Encounter for screening for malignant neoplasm of cervix: Secondary | ICD-10-CM | POA: Diagnosis not present

## 2024-07-03 DIAGNOSIS — R87612 Low grade squamous intraepithelial lesion on cytologic smear of cervix (LGSIL): Secondary | ICD-10-CM | POA: Diagnosis not present

## 2024-07-17 DIAGNOSIS — I341 Nonrheumatic mitral (valve) prolapse: Secondary | ICD-10-CM | POA: Diagnosis not present

## 2024-07-17 DIAGNOSIS — Z0000 Encounter for general adult medical examination without abnormal findings: Secondary | ICD-10-CM | POA: Diagnosis not present

## 2024-07-17 DIAGNOSIS — E782 Mixed hyperlipidemia: Secondary | ICD-10-CM | POA: Diagnosis not present

## 2024-07-17 DIAGNOSIS — R002 Palpitations: Secondary | ICD-10-CM | POA: Diagnosis not present

## 2024-07-17 DIAGNOSIS — E559 Vitamin D deficiency, unspecified: Secondary | ICD-10-CM | POA: Diagnosis not present
